# Patient Record
Sex: Male | Born: 1945 | Race: White | Hispanic: No | Marital: Married
Health system: Southern US, Community
[De-identification: ages and names within clinical notes are randomized; demographics above are authoritative.]

## PROBLEM LIST (undated history)

## (undated) DIAGNOSIS — I4891 Unspecified atrial fibrillation: Secondary | ICD-10-CM

## (undated) DIAGNOSIS — J309 Allergic rhinitis, unspecified: Secondary | ICD-10-CM

## (undated) DIAGNOSIS — D649 Anemia, unspecified: Secondary | ICD-10-CM

## (undated) DIAGNOSIS — M5030 Other cervical disc degeneration, unspecified cervical region: Secondary | ICD-10-CM

## (undated) DIAGNOSIS — M5136 Other intervertebral disc degeneration, lumbar region: Secondary | ICD-10-CM

## (undated) DIAGNOSIS — E538 Deficiency of other specified B group vitamins: Secondary | ICD-10-CM

## (undated) DIAGNOSIS — M51369 Other intervertebral disc degeneration, lumbar region without mention of lumbar back pain or lower extremity pain: Secondary | ICD-10-CM

## (undated) DIAGNOSIS — I10 Essential (primary) hypertension: Secondary | ICD-10-CM

## (undated) DIAGNOSIS — I499 Cardiac arrhythmia, unspecified: Secondary | ICD-10-CM

---

## 2005-07-20 ENCOUNTER — Ambulatory Visit: Payer: Self-pay | Admitting: Gastroenterology

## 2006-07-25 ENCOUNTER — Other Ambulatory Visit: Payer: Self-pay

## 2006-07-25 ENCOUNTER — Ambulatory Visit: Payer: Self-pay | Admitting: General Surgery

## 2006-07-26 ENCOUNTER — Ambulatory Visit: Payer: Self-pay | Admitting: General Surgery

## 2015-06-13 ENCOUNTER — Ambulatory Visit: Admission: RE | Admit: 2015-06-13 | Payer: Medicare PPO | Source: Ambulatory Visit | Admitting: Gastroenterology

## 2015-06-13 ENCOUNTER — Encounter: Admission: RE | Payer: Self-pay | Source: Ambulatory Visit

## 2015-06-13 SURGERY — COLONOSCOPY WITH PROPOFOL
Anesthesia: General

## 2015-07-29 ENCOUNTER — Encounter: Payer: Self-pay | Admitting: *Deleted

## 2015-08-01 ENCOUNTER — Ambulatory Visit
Admission: RE | Admit: 2015-08-01 | Discharge: 2015-08-01 | Disposition: A | Discharge status: Home / Self Care | Payer: Medicare PPO | Source: Ambulatory Visit | Attending: Gastroenterology | Admitting: Gastroenterology

## 2015-08-01 ENCOUNTER — Ambulatory Visit: Payer: Medicare PPO | Admitting: Anesthesiology

## 2015-08-01 ENCOUNTER — Encounter
Admission: RE | Disposition: A | Discharge status: Home / Self Care | Payer: Self-pay | Source: Ambulatory Visit | Attending: Gastroenterology

## 2015-08-01 DIAGNOSIS — M5136 Other intervertebral disc degeneration, lumbar region: Secondary | ICD-10-CM | POA: Insufficient documentation

## 2015-08-01 DIAGNOSIS — J309 Allergic rhinitis, unspecified: Secondary | ICD-10-CM | POA: Diagnosis not present

## 2015-08-01 DIAGNOSIS — Z888 Allergy status to other drugs, medicaments and biological substances status: Secondary | ICD-10-CM | POA: Insufficient documentation

## 2015-08-01 DIAGNOSIS — M5030 Other cervical disc degeneration, unspecified cervical region: Secondary | ICD-10-CM | POA: Diagnosis not present

## 2015-08-01 DIAGNOSIS — Z881 Allergy status to other antibiotic agents status: Secondary | ICD-10-CM | POA: Insufficient documentation

## 2015-08-01 DIAGNOSIS — E538 Deficiency of other specified B group vitamins: Secondary | ICD-10-CM | POA: Diagnosis not present

## 2015-08-01 DIAGNOSIS — K5730 Diverticulosis of large intestine without perforation or abscess without bleeding: Secondary | ICD-10-CM | POA: Insufficient documentation

## 2015-08-01 DIAGNOSIS — Z79899 Other long term (current) drug therapy: Secondary | ICD-10-CM | POA: Insufficient documentation

## 2015-08-01 DIAGNOSIS — I10 Essential (primary) hypertension: Secondary | ICD-10-CM | POA: Insufficient documentation

## 2015-08-01 DIAGNOSIS — I499 Cardiac arrhythmia, unspecified: Secondary | ICD-10-CM | POA: Diagnosis not present

## 2015-08-01 DIAGNOSIS — I4891 Unspecified atrial fibrillation: Secondary | ICD-10-CM | POA: Insufficient documentation

## 2015-08-01 DIAGNOSIS — D509 Iron deficiency anemia, unspecified: Secondary | ICD-10-CM | POA: Insufficient documentation

## 2015-08-01 HISTORY — PX: COLONOSCOPY WITH PROPOFOL: SHX5780

## 2015-08-01 HISTORY — DX: Other cervical disc degeneration, unspecified cervical region: M50.30

## 2015-08-01 HISTORY — DX: Anemia, unspecified: D64.9

## 2015-08-01 HISTORY — DX: Deficiency of other specified B group vitamins: E53.8

## 2015-08-01 HISTORY — DX: Other intervertebral disc degeneration, lumbar region without mention of lumbar back pain or lower extremity pain: M51.369

## 2015-08-01 HISTORY — DX: Allergic rhinitis, unspecified: J30.9

## 2015-08-01 HISTORY — DX: Essential (primary) hypertension: I10

## 2015-08-01 HISTORY — DX: Cardiac arrhythmia, unspecified: I49.9

## 2015-08-01 HISTORY — DX: Other intervertebral disc degeneration, lumbar region: M51.36

## 2015-08-01 HISTORY — PX: ESOPHAGOGASTRODUODENOSCOPY (EGD) WITH PROPOFOL: SHX5813

## 2015-08-01 HISTORY — DX: Unspecified atrial fibrillation: I48.91

## 2015-08-01 SURGERY — COLONOSCOPY WITH PROPOFOL
Anesthesia: General

## 2015-08-01 MED ORDER — MIDAZOLAM HCL 2 MG/2ML IJ SOLN
INTRAMUSCULAR | Status: DC | PRN
  Administered 2015-08-01: 1 mg via INTRAVENOUS

## 2015-08-01 MED ORDER — PROPOFOL 500 MG/50ML IV EMUL
INTRAVENOUS | Status: DC | PRN
  Administered 2015-08-01: 75 ug/kg/min via INTRAVENOUS

## 2015-08-01 MED ORDER — FENTANYL CITRATE (PF) 100 MCG/2ML IJ SOLN
INTRAMUSCULAR | Status: DC | PRN
  Administered 2015-08-01: 50 ug via INTRAVENOUS

## 2015-08-01 MED ORDER — SODIUM CHLORIDE 0.9 % IV SOLN
INTRAVENOUS | Status: DC
  Administered 2015-08-01: 1000 mL via INTRAVENOUS

## 2015-08-01 MED ORDER — LACTATED RINGERS IV SOLN
INTRAVENOUS | Status: DC | PRN
  Administered 2015-08-01: 08:00:00 via INTRAVENOUS

## 2015-08-01 MED ORDER — SODIUM CHLORIDE 0.9 % IV SOLN: INTRAVENOUS | Status: DC

## 2015-08-01 NOTE — Transfer of Care (Signed)
Immediate Anesthesia Transfer of Care Note  Patient: Timothy Gilbert  Procedure(s) Performed: Procedure(s): COLONOSCOPY WITH PROPOFOL (N/A) ESOPHAGOGASTRODUODENOSCOPY (EGD) WITH PROPOFOL (N/A)  Patient Location: PACU  Anesthesia Type:General  Level of Consciousness: awake  Airway & Oxygen Therapy: Patient Spontanous Breathing and Patient connected to nasal cannula oxygen  Post-op Assessment: Report given to RN  Post vital signs: Reviewed and stable  Last Vitals:  Filed Vitals:   08/01/15 0712 08/01/15 0832  BP: 149/73 153/65  Pulse: 56 75  Temp: 35.9 C 36.4 C  Resp: 16 16    Complications: No apparent anesthesia complications

## 2015-08-01 NOTE — Op Note (Signed)
[facility] Gastroenterology Patient Name: Timothy Gilbert Procedure Date: 08/01/2015 9:27 AM MRN: [id number] Account #: [id number] Date of Birth: [birth year] Admit Type: Outpatient Age: 70 Room: [facility] ENDO ROOM 4 Gender: Male Note Status: Finalized Procedure:            Upper GI endoscopy Indications:          Iron deficiency anemia Providers:            Ezzard Standing. Bluford Kaufmann, MD Medicines:            Monitored Anesthesia Care Complications:        No immediate complications. Procedure:            Pre-Anesthesia Assessment:                       - Prior to the procedure, a History and Physical was                        performed, and patient medications, allergies and                        sensitivities were reviewed. The patient's tolerance of                        previous anesthesia was reviewed.                       - The risks and benefits of the procedure and the                        sedation options and risks were discussed with the                        patient. All questions were answered and informed                        consent was obtained.                       - After reviewing the risks and benefits, the patient                        was deemed in satisfactory condition to undergo the                        procedure.                       After obtaining informed consent, the endoscope was                        passed under direct vision. Throughout the procedure,                        the patient's blood pressure, pulse, and oxygen                        saturations were monitored continuously. The Olympus                        GIF H180J colonscope (W#:[id number]) was introduced  through the and advanced to the second part of                        duodenum. The upper GI endoscopy was accomplished                        without difficulty. The patient tolerated the procedure                        well. Findings:      The examined  esophagus was normal.      The entire examined stomach was normal.      The examined duodenum was normal. Impression:           - Normal esophagus.                       - Normal stomach.                       - Normal examined duodenum.                       - No specimens collected. Recommendation:       - Discharge patient to home.                       - Observe patient's clinical course.                       - The findings and recommendations were discussed with                        the patient. Procedure Code(s):    --- Professional ---                       [phone], Esophagogastroduodenoscopy, flexible, transoral;                        diagnostic, including collection of specimen(s) by                        brushing or washing, when performed (separate procedure) Diagnosis Code(s):    --- Professional ---                       D50.9, Iron deficiency anemia, unspecified CPT copyright 2016 American Medical Association. All rights reserved. The codes documented in this report are preliminary and upon coder review may  be revised to meet current compliance requirements. Wallace Cullens, MD 08/01/2015 9:32:58 AM This report has been signed electronically. Number of Addenda: 0 Note Initiated On: 08/01/2015 9:27 AM      [facility] [place]

## 2015-08-01 NOTE — H&P (Signed)
    Primary Care Physician:  Ezequiel Kayser, MD Primary Gastroenterologist:  Dr. Candace Cruise  Pre-Procedure History & Physical: HPI:  Timothy Gilbert is a 70 y.o. male is here for an EGD/colonoscopy.   Past Medical History  Diagnosis Date  . Atrial fibrillation ([place])   . DDD (degenerative disc disease), cervical   . Anemia   . Hypertension   . Dysrhythmia   . B12 deficiency   . Degenerative disc disease, lumbar   . Allergic rhinitis     No past surgical history on file.  Prior to Admission medications   Medication Sig Start Date End Date Taking? Authorizing Provider  atenolol (TENORMIN) 50 MG tablet Take 50 mg by mouth daily.   Yes Historical Provider, MD  Cyanocobalamin 1000 MCG/ML KIT Inject as directed.   Yes Historical Provider, MD  flecainide (TAMBOCOR) 100 MG tablet Take 100 mg by mouth 2 (two) times daily.   Yes Historical Provider, MD  ibuprofen (ADVIL,MOTRIN) 200 MG tablet Take 200 mg by mouth every 6 (six) hours as needed.   Yes Historical Provider, MD  loratadine (CLARITIN) 10 MG tablet Take 10 mg by mouth daily.   Yes Historical Provider, MD  Multiple Vitamin (MULTIVITAMIN) tablet Take 1 tablet by mouth daily.   Yes Historical Provider, MD  valsartan-hydrochlorothiazide (DIOVAN-HCT) 320-25 MG tablet Take 1 tablet by mouth daily.   Yes Historical Provider, MD    Allergies as of 07/13/2015 - never reviewed  Allergen Reaction Noted  . Augmentin [amoxicillin-pot clavulanate]  06/10/2015  . Hctz [hydrochlorothiazide]  06/10/2015    No family history on file.  Social History   Social History  . Marital Status: Married    Spouse Name: N/A  . Number of Children: N/A  . Years of Education: N/A   Occupational History  . Not on file.   Social History Main Topics  . Smoking status: Not on file  . Smokeless tobacco: Not on file  . Alcohol Use: Not on file  . Drug Use: Not on file  . Sexual Activity: Not on file   Other Topics Concern  . Not on file   Social History  Narrative  . No narrative on file    Review of Systems: See HPI, otherwise negative ROS  Physical Exam: BP 149/73 mmHg  Pulse 56  Temp(Src) 96.6 F (35.9 C) (Tympanic)  Resp 16  Ht '6\' 3"'$  (1.905 m)  Wt 88.451 kg (195 lb)  BMI 24.37 kg/m2  SpO2 100% General:   Alert,  pleasant and cooperative in NAD Head:  Normocephalic and atraumatic. Neck:  Supple; no masses or thyromegaly. Lungs:  Clear throughout to auscultation.    Heart:  Regular rate and rhythm. Abdomen:  Soft, nontender and nondistended. Normal bowel sounds, without guarding, and without rebound.   Neurologic:  Alert and  oriented x4;  grossly normal neurologically.  Impression/Plan: Timothy Gilbert is here for an EGD/colonoscopy to be performed for Fe def anemia.  Risks, benefits, limitations, and alternatives regarding  EGD/colonoscopy have been reviewed with the patient.  Questions have been answered.  All parties agreeable.   Maurisa Tesmer, Lupita Dawn, MD  08/01/2015, 8:03 AM

## 2015-08-01 NOTE — Anesthesia Preprocedure Evaluation (Signed)
Anesthesia Evaluation  Patient identified by MRN, date of birth, ID band Patient awake    Reviewed: Allergy & Precautions, H&P , NPO status , Patient's Chart, lab work & pertinent test results, reviewed documented beta blocker date and time   Airway Mallampati: II   Neck ROM: full    Dental  (+) Poor Dentition   Pulmonary neg pulmonary ROS,    Pulmonary exam normal        Cardiovascular hypertension, negative cardio ROS Normal cardiovascular exam+ dysrhythmias Atrial Fibrillation      Neuro/Psych negative neurological ROS  negative psych ROS   GI/Hepatic negative GI ROS, Neg liver ROS,   Endo/Other  negative endocrine ROS  Renal/GU negative Renal ROS  negative genitourinary   Musculoskeletal   Abdominal   Peds  Hematology negative hematology ROS (+) anemia ,   Anesthesia Other Findings Past Medical History:   Atrial fibrillation ([facility])                                    DDD (degenerative disc disease), cervical                    Anemia                                                       Hypertension                                                 Dysrhythmia                                                  B12 deficiency                                               Degenerative disc disease, lumbar                            Allergic rhinitis                                          No past surgical history on file.   Reproductive/Obstetrics                             Anesthesia Physical Anesthesia Plan  ASA: III  Anesthesia Plan: General   Post-op Pain Management:    Induction:   Airway Management Planned:   Additional Equipment:   Intra-op Plan:   Post-operative Plan:   Informed Consent: I have reviewed the patients History and Physical, chart, labs and discussed the procedure including the risks, benefits and alternatives for the proposed anesthesia with the patient  or authorized representative  who has indicated his/her understanding and acceptance.   Dental Advisory Given  Plan Discussed with: CRNA  Anesthesia Plan Comments:         Anesthesia Quick Evaluation

## 2015-08-01 NOTE — Anesthesia Postprocedure Evaluation (Deleted)
Anesthesia Post Note  Patient: Nisaiah Bechtol [place]  Procedure(s) Performed: Procedure(s) (LRB): COLONOSCOPY WITH PROPOFOL (N/A) ESOPHAGOGASTRODUODENOSCOPY (EGD) WITH PROPOFOL (N/A)  Patient location during evaluation: PACU Anesthesia Type: General Level of consciousness: awake and alert Pain management: pain level controlled Vital Signs Assessment: post-procedure vital signs reviewed and stable Respiratory status: spontaneous breathing, nonlabored ventilation, respiratory function stable and patient connected to nasal cannula oxygen Cardiovascular status: blood pressure returned to baseline and stable Postop Assessment: no signs of nausea or vomiting Anesthetic complications: no    Last Vitals:  Filed Vitals:   08/01/15 0712  BP: 149/73  Pulse: 56  Temp: 35.9 C  Resp: 16    Last Pain: There were no vitals filed for this visit.               Yevette Edwards

## 2015-08-01 NOTE — Anesthesia Postprocedure Evaluation (Signed)
Anesthesia Post Note  Patient: Timothy Gilbert  Procedure(s) Performed: Procedure(s) (LRB): COLONOSCOPY WITH PROPOFOL (N/A) ESOPHAGOGASTRODUODENOSCOPY (EGD) WITH PROPOFOL (N/A)  Patient location during evaluation: PACU Anesthesia Type: General Level of consciousness: awake and alert Pain management: pain level controlled Vital Signs Assessment: post-procedure vital signs reviewed and stable Respiratory status: spontaneous breathing, nonlabored ventilation, respiratory function stable and patient connected to nasal cannula oxygen Cardiovascular status: blood pressure returned to baseline and stable Postop Assessment: no signs of nausea or vomiting Anesthetic complications: no    Last Vitals:  Filed Vitals:   08/01/15 0901 08/01/15 0911  BP: 150/77 152/94  Pulse: 55 53  Temp:    Resp: 16 14    Last Pain:  Filed Vitals:   08/01/15 0916  PainSc: Asleep                 Yevette Edwards

## 2015-08-01 NOTE — Op Note (Addendum)
[facility] Gastroenterology Patient Name: Timothy Gilbert Procedure Date: 08/01/2015 7:58 AM MRN: [id number] Account #: [id number] Date of Birth: May 05, 1946 Admit Type: Outpatient Age: 70 Room: [facility] ENDO ROOM 4 Gender: Male Note Status: Supervisor Override Procedure:            Colonoscopy Indications:          Iron deficiency anemia Providers:            Ezzard Standing. Bluford Kaufmann, MD Referring MD:         Neomia Dear. Harrington Challenger, MD (Referring MD) Medicines:            Monitored Anesthesia Care Complications:        No immediate complications. Procedure:            Pre-Anesthesia Assessment:                       - Prior to the procedure, a History and Physical was                        performed, and patient medications, allergies and                        sensitivities were reviewed. The patient's tolerance of                        previous anesthesia was reviewed.                       - The risks and benefits of the procedure and the                        sedation options and risks were discussed with the                        patient. All questions were answered and informed                        consent was obtained.                       - After reviewing the risks and benefits, the patient                        was deemed in satisfactory condition to undergo the                        procedure.                       After obtaining informed consent, the colonoscope was                        passed under direct vision. Throughout the procedure,                        the patient's blood pressure, pulse, and oxygen                        saturations were monitored continuously.The upper GI  endoscopy was accomplished without difficulty. The                        patient tolerated the procedure well. After obtaining                        informed consent, the colonoscope was passed under                        direct vision. Throughout the procedure, the  patient's                        blood pressure, pulse, and oxygen saturations were                        monitored continuously. The Colonoscope was introduced                        through the anus and advanced to the the cecum,                        identified by appendiceal orifice and ileocecal valve.                        The quality of the bowel preparation was good. Findings:      Multiple small and large-mouthed diverticula were found in the sigmoid       colon.      The exam was otherwise without abnormality. Impression:           - Diverticulosis in the sigmoid colon.                       - The examination was otherwise normal.                       - No specimens collected. Recommendation:       - Discharge patient to home.                       - The findings and recommendations were discussed with                        the patient.                       - If anemia persists, consider video capsule study. Procedure Code(s):    --- Professional ---                       [phone], Colonoscopy, flexible; diagnostic, including                        collection of specimen(s) by brushing or washing, when                        performed (separate procedure) Diagnosis Code(s):    --- Professional ---                       D50.9, Iron deficiency anemia, unspecified  K57.30, Diverticulosis of large intestine without                        perforation or abscess without bleeding CPT copyright 2016 American Medical Association. All rights reserved. The codes documented in this report are preliminary and upon coder review may  be revised to meet current compliance requirements. Wallace Cullens, MD 08/01/2015 8:34:13 AM This report has been signed electronically. Number of Addenda: 0 Note Initiated On: 08/01/2015 7:58 AM Scope Withdrawal Time: 0 hours 4 minutes 24 seconds  Total Procedure Duration: 0 hours 11 minutes 58 seconds       [facility]

## 2015-08-03 ENCOUNTER — Encounter: Payer: Self-pay | Admitting: Gastroenterology

## 2016-02-13 ENCOUNTER — Other Ambulatory Visit: Payer: Self-pay | Admitting: Physical Medicine and Rehabilitation

## 2016-02-13 DIAGNOSIS — M47812 Spondylosis without myelopathy or radiculopathy, cervical region: Secondary | ICD-10-CM

## 2016-02-22 ENCOUNTER — Ambulatory Visit
Admission: RE | Admit: 2016-02-22 | Discharge: 2016-02-22 | Disposition: A | Discharge status: Home / Self Care | Payer: Medicare PPO | Source: Ambulatory Visit | Attending: Physical Medicine and Rehabilitation | Admitting: Physical Medicine and Rehabilitation

## 2016-02-22 DIAGNOSIS — M47812 Spondylosis without myelopathy or radiculopathy, cervical region: Secondary | ICD-10-CM

## 2016-02-22 DIAGNOSIS — M50222 Other cervical disc displacement at C5-C6 level: Secondary | ICD-10-CM | POA: Diagnosis not present

## 2017-09-27 IMAGING — MR MR CERVICAL SPINE W/O CM
5 series · 34 of 48 positions shown · non-contrast
Comparison: None.

CLINICAL DATA: Neck pain for several months radiating into the
head. No known injury.

EXAM:
MRI CERVICAL SPINE WITHOUT CONTRAST
TECHNIQUE: Multiplanar, multisequence MR imaging of the cervical spine was
performed. No intravenous contrast was administered.

[Series 2: T2 · sagittal · 3.0mm · 0.70mm/px · 6 of 13 slices shown (1 of 2)]
[im 1/13]
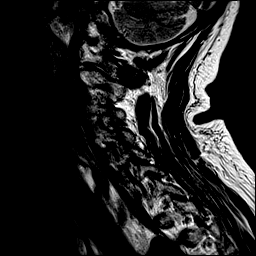
[im 3/13]
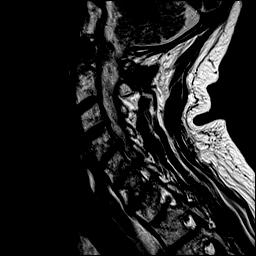
[im 5/13]
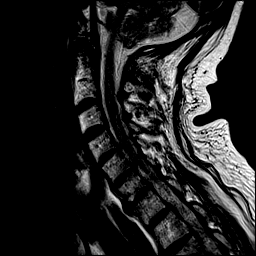
[im 8/13]
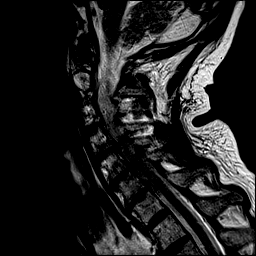
[im 10/13]
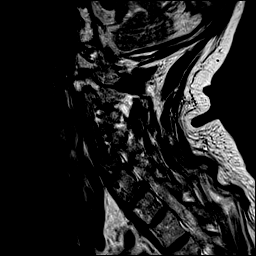
[im 13/13]
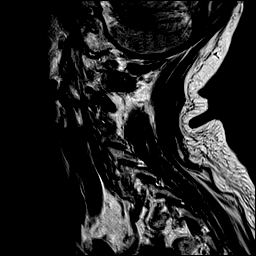

[Series 3: T1 · sagittal · 3.0mm · 0.70mm/px · 7 of 13 slices shown]
[im 1/13]
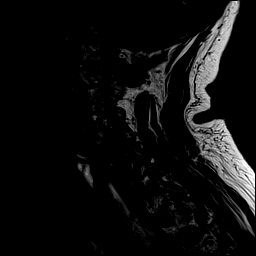
[im 3/13]
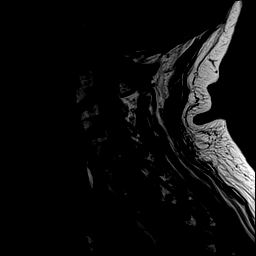
[im 5/13]
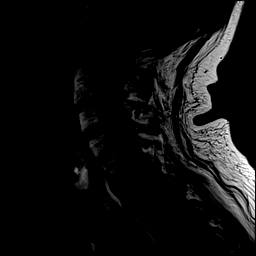
[im 7/13]
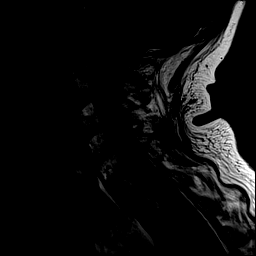
[im 9/13]
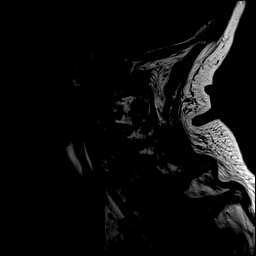
[im 11/13]
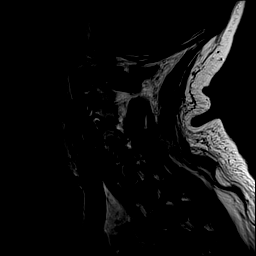
[im 13/13]
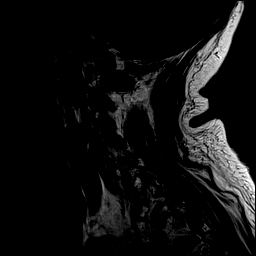

[Series 4: STIR · sagittal · 3.0mm · 0.35mm/px · 7 of 13 slices shown]
[im 1/13]
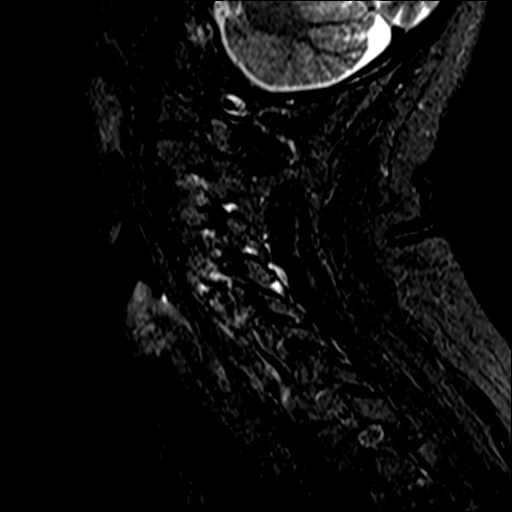
[im 3/13]
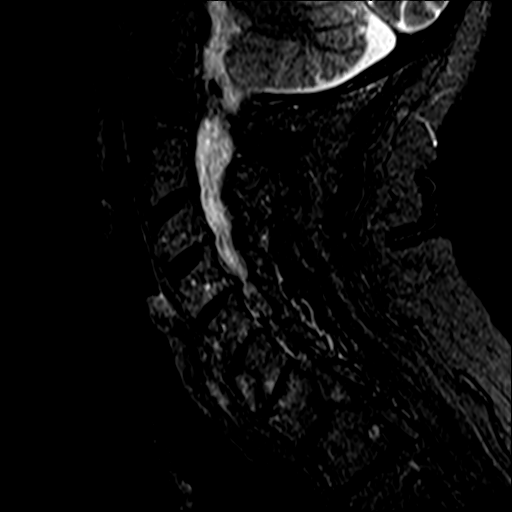
[im 5/13]
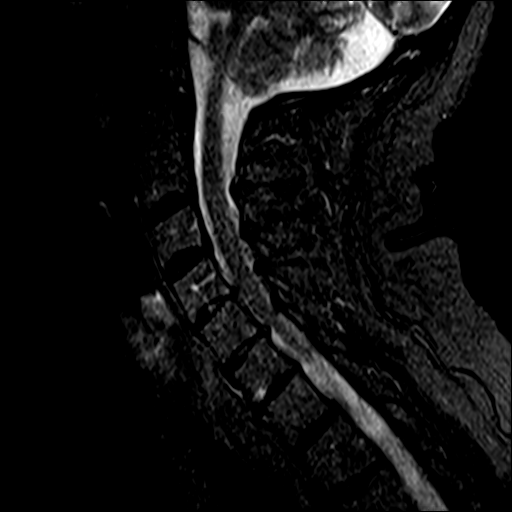
[im 7/13]
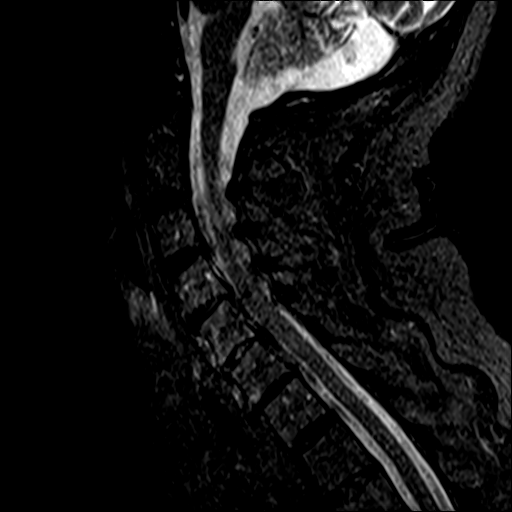
[im 9/13]
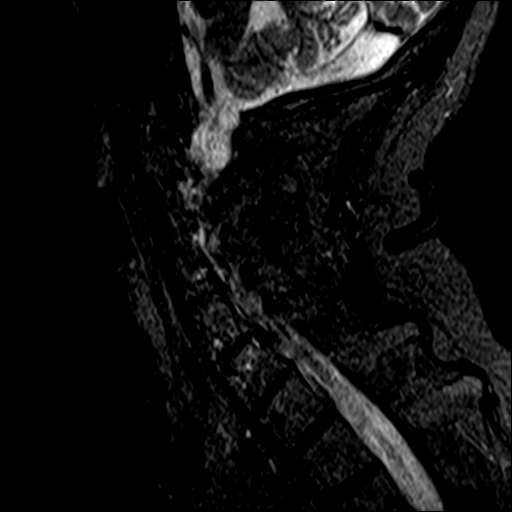
[im 11/13]
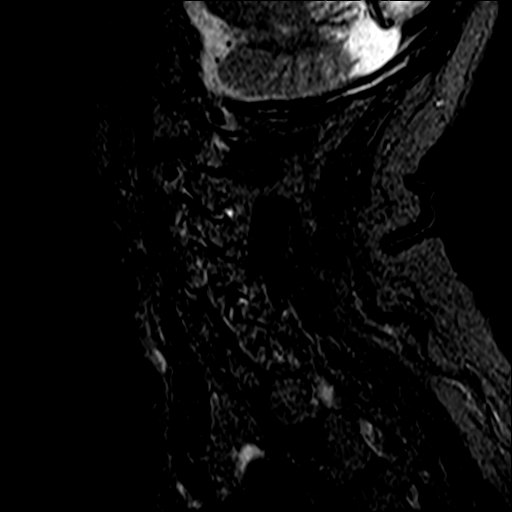
[im 13/13]
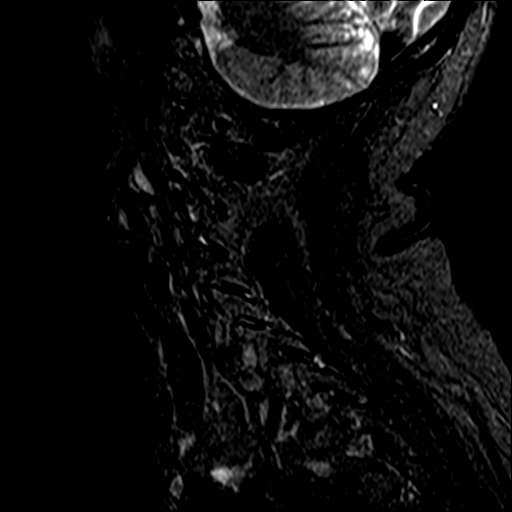

[Series 5: T2 · axial · 3.0mm · 0.70mm/px · z∈[-54,+37]mm · 8 of 27 slices shown (2 of 2)]
[im 1/27]
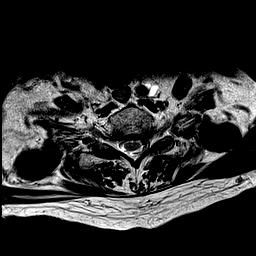
[im 5/27]
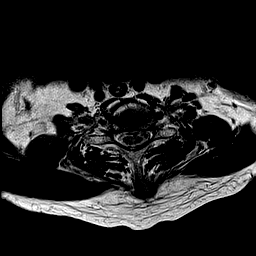
[im 9/27]
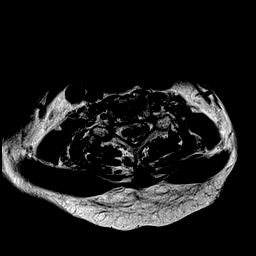
[im 13/27]
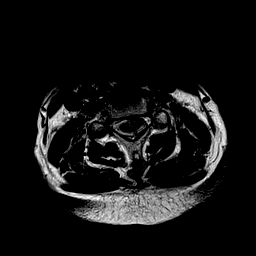
[im 15/27]
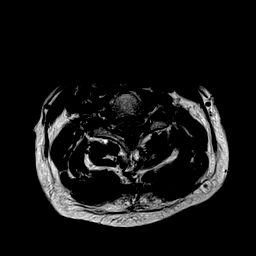
[im 19/27]
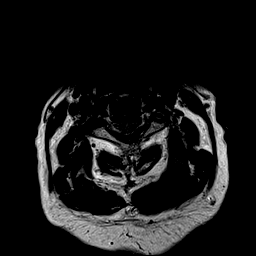
[im 23/27]
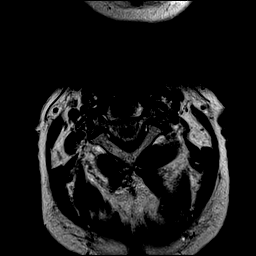
[im 27/27]
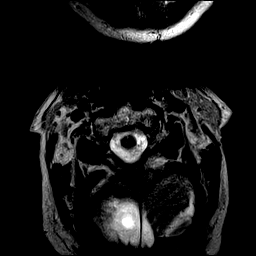

[Series 6: mpgr ax · axial · 3.0mm · 0.35mm/px · z∈[-54,+9]mm · 6 of 27 slices shown]
[im 1/27]
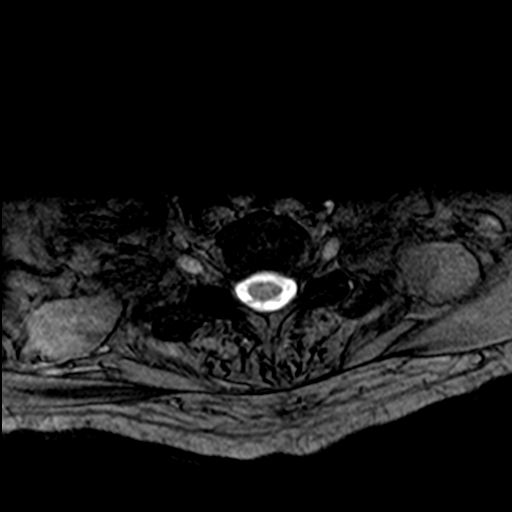
[im 5/27]
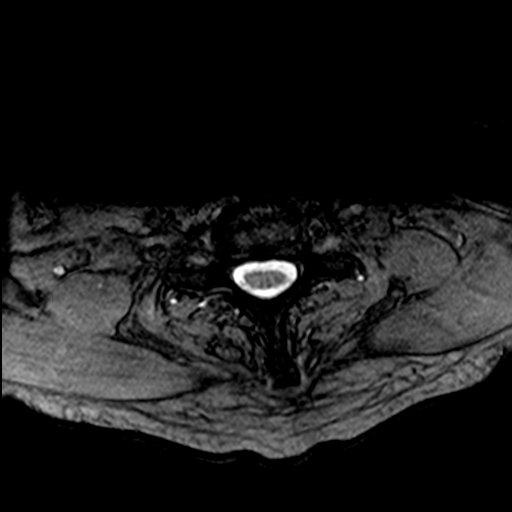
[im 9/27]
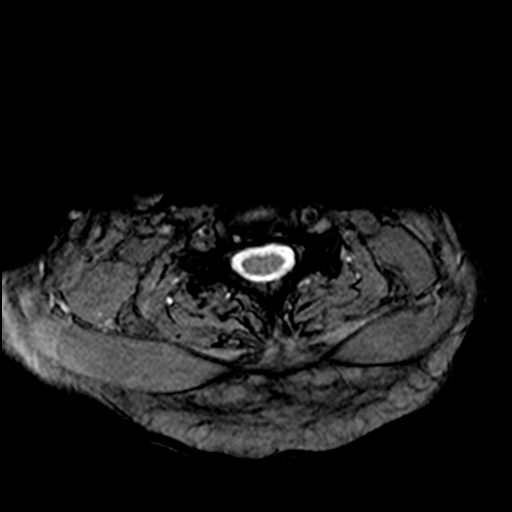
[im 13/27]
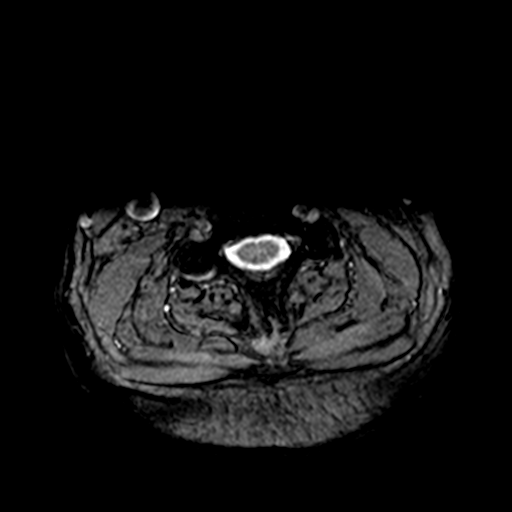
[im 15/27]
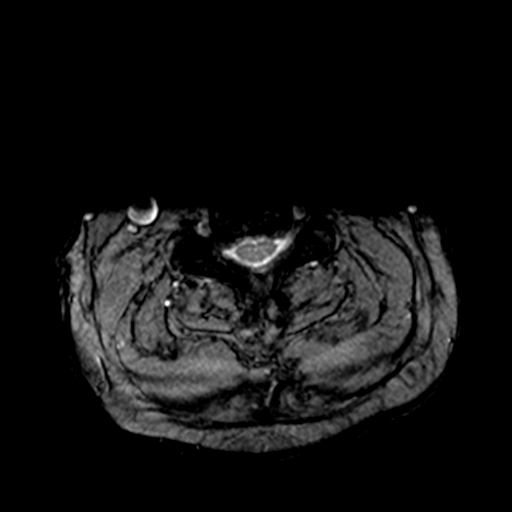
[im 19/27]
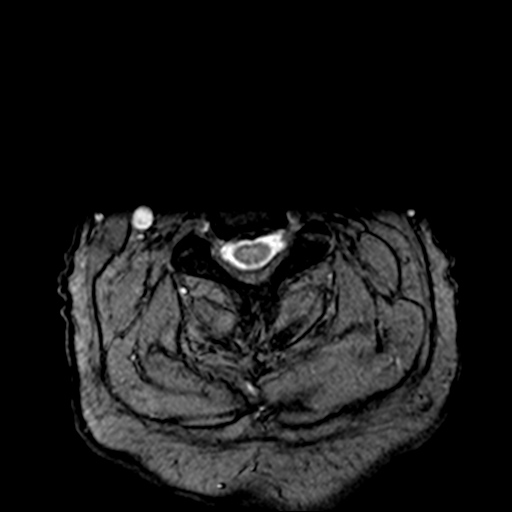

[34 of 48 positions shown; findings below may reference images not displayed]

FINDINGS: Alignment: Maintained.

Vertebrae: No fracture. No worrisome marrow lesion. Degenerative
endplate signal change noted at C6-7 eccentric to the right.

Cord: Appears normal.

Posterior Fossa, vertebral arteries, paraspinal tissues:
Unremarkable.

Disc levels:

C2-3:  Negative.

C3-4: Minimal disc bulge without central canal or foraminal
stenosis.

C4-5: Shallow disc bulge and endplate spur without central canal or
foraminal stenosis.

C5-6: Shallow disc bulge and uncovertebral disease. The ventral
thecal sac is narrowed but not effaced. Mild to moderate bilateral
foraminal narrowing is seen.

C6-7: Shallow disc bulge without central canal stenosis. Mild left
foraminal narrowing is seen. The right foramen is open.

C7-T1:  Negative.
IMPRESSION: Mild to moderate bilateral foraminal narrowing at C5-6 where a
shallow disc bulge narrows but does not efface the ventral thecal
sac.

Mild left foraminal narrowing C6-7 due to uncovertebral disease.

## 2019-06-10 ENCOUNTER — Ambulatory Visit: Payer: Medicare PPO | Attending: Internal Medicine

## 2019-06-10 DIAGNOSIS — Z20822 Contact with and (suspected) exposure to covid-19: Secondary | ICD-10-CM

## 2019-06-11 LAB — NOVEL CORONAVIRUS, NAA: SARS-CoV-2, NAA: NOT DETECTED

## 2019-06-12 ENCOUNTER — Telehealth: Payer: Self-pay

## 2019-06-12 NOTE — Telephone Encounter (Signed)
Pt notified of negative COVID-19 results. Understanding verbalized.  Timothy Gilbert   

## 2021-06-09 DIAGNOSIS — I480 Paroxysmal atrial fibrillation: Secondary | ICD-10-CM | POA: Diagnosis not present

## 2021-06-12 DIAGNOSIS — E538 Deficiency of other specified B group vitamins: Secondary | ICD-10-CM | POA: Diagnosis not present

## 2021-07-12 DIAGNOSIS — I480 Paroxysmal atrial fibrillation: Secondary | ICD-10-CM | POA: Diagnosis not present

## 2021-07-12 DIAGNOSIS — I10 Essential (primary) hypertension: Secondary | ICD-10-CM | POA: Diagnosis not present

## 2021-07-12 DIAGNOSIS — Z7901 Long term (current) use of anticoagulants: Secondary | ICD-10-CM | POA: Diagnosis not present

## 2021-07-13 DIAGNOSIS — E538 Deficiency of other specified B group vitamins: Secondary | ICD-10-CM | POA: Diagnosis not present

## 2021-08-10 DIAGNOSIS — E538 Deficiency of other specified B group vitamins: Secondary | ICD-10-CM | POA: Diagnosis not present

## 2021-09-11 DIAGNOSIS — E538 Deficiency of other specified B group vitamins: Secondary | ICD-10-CM | POA: Diagnosis not present

## 2021-10-11 DIAGNOSIS — I480 Paroxysmal atrial fibrillation: Secondary | ICD-10-CM | POA: Diagnosis not present

## 2021-10-11 DIAGNOSIS — I10 Essential (primary) hypertension: Secondary | ICD-10-CM | POA: Diagnosis not present

## 2021-10-11 DIAGNOSIS — Z7901 Long term (current) use of anticoagulants: Secondary | ICD-10-CM | POA: Diagnosis not present

## 2021-10-12 DIAGNOSIS — E538 Deficiency of other specified B group vitamins: Secondary | ICD-10-CM | POA: Diagnosis not present

## 2021-11-08 DIAGNOSIS — M216X1 Other acquired deformities of right foot: Secondary | ICD-10-CM | POA: Diagnosis not present

## 2021-11-08 DIAGNOSIS — M2041 Other hammer toe(s) (acquired), right foot: Secondary | ICD-10-CM | POA: Diagnosis not present

## 2021-11-08 DIAGNOSIS — S91311A Laceration without foreign body, right foot, initial encounter: Secondary | ICD-10-CM | POA: Diagnosis not present

## 2021-11-08 DIAGNOSIS — M216X2 Other acquired deformities of left foot: Secondary | ICD-10-CM | POA: Diagnosis not present

## 2021-11-08 DIAGNOSIS — M5136 Other intervertebral disc degeneration, lumbar region: Secondary | ICD-10-CM | POA: Diagnosis not present

## 2021-11-08 DIAGNOSIS — M79675 Pain in left toe(s): Secondary | ICD-10-CM | POA: Diagnosis not present

## 2021-11-08 DIAGNOSIS — M2042 Other hammer toe(s) (acquired), left foot: Secondary | ICD-10-CM | POA: Diagnosis not present

## 2021-11-08 DIAGNOSIS — B351 Tinea unguium: Secondary | ICD-10-CM | POA: Diagnosis not present

## 2021-11-08 DIAGNOSIS — G629 Polyneuropathy, unspecified: Secondary | ICD-10-CM | POA: Diagnosis not present

## 2021-11-08 DIAGNOSIS — M79674 Pain in right toe(s): Secondary | ICD-10-CM | POA: Diagnosis not present

## 2021-11-08 DIAGNOSIS — Q6671 Congenital pes cavus, right foot: Secondary | ICD-10-CM | POA: Diagnosis not present

## 2021-11-08 DIAGNOSIS — Q6672 Congenital pes cavus, left foot: Secondary | ICD-10-CM | POA: Diagnosis not present

## 2021-11-13 DIAGNOSIS — E538 Deficiency of other specified B group vitamins: Secondary | ICD-10-CM | POA: Diagnosis not present

## 2021-12-13 DIAGNOSIS — E538 Deficiency of other specified B group vitamins: Secondary | ICD-10-CM | POA: Diagnosis not present

## 2022-01-15 DIAGNOSIS — E538 Deficiency of other specified B group vitamins: Secondary | ICD-10-CM | POA: Diagnosis not present

## 2022-02-13 DIAGNOSIS — H10503 Unspecified blepharoconjunctivitis, bilateral: Secondary | ICD-10-CM | POA: Diagnosis not present

## 2022-02-15 DIAGNOSIS — E538 Deficiency of other specified B group vitamins: Secondary | ICD-10-CM | POA: Diagnosis not present

## 2022-02-27 DIAGNOSIS — H10503 Unspecified blepharoconjunctivitis, bilateral: Secondary | ICD-10-CM | POA: Diagnosis not present

## 2022-03-08 DIAGNOSIS — M47812 Spondylosis without myelopathy or radiculopathy, cervical region: Secondary | ICD-10-CM | POA: Diagnosis not present

## 2022-03-08 DIAGNOSIS — Z1329 Encounter for screening for other suspected endocrine disorder: Secondary | ICD-10-CM | POA: Diagnosis not present

## 2022-03-08 DIAGNOSIS — E538 Deficiency of other specified B group vitamins: Secondary | ICD-10-CM | POA: Diagnosis not present

## 2022-03-08 DIAGNOSIS — D6869 Other thrombophilia: Secondary | ICD-10-CM | POA: Diagnosis not present

## 2022-03-08 DIAGNOSIS — M722 Plantar fascial fibromatosis: Secondary | ICD-10-CM | POA: Diagnosis not present

## 2022-03-08 DIAGNOSIS — I480 Paroxysmal atrial fibrillation: Secondary | ICD-10-CM | POA: Diagnosis not present

## 2022-03-08 DIAGNOSIS — Z7901 Long term (current) use of anticoagulants: Secondary | ICD-10-CM | POA: Diagnosis not present

## 2022-03-08 DIAGNOSIS — R7309 Other abnormal glucose: Secondary | ICD-10-CM | POA: Diagnosis not present

## 2022-03-08 DIAGNOSIS — Z79899 Other long term (current) drug therapy: Secondary | ICD-10-CM | POA: Diagnosis not present

## 2022-03-08 DIAGNOSIS — Z125 Encounter for screening for malignant neoplasm of prostate: Secondary | ICD-10-CM | POA: Diagnosis not present

## 2022-03-08 DIAGNOSIS — J301 Allergic rhinitis due to pollen: Secondary | ICD-10-CM | POA: Diagnosis not present

## 2022-03-08 DIAGNOSIS — M5136 Other intervertebral disc degeneration, lumbar region: Secondary | ICD-10-CM | POA: Diagnosis not present

## 2022-03-08 DIAGNOSIS — Z0000 Encounter for general adult medical examination without abnormal findings: Secondary | ICD-10-CM | POA: Diagnosis not present

## 2022-03-08 DIAGNOSIS — Z13220 Encounter for screening for lipoid disorders: Secondary | ICD-10-CM | POA: Diagnosis not present

## 2022-03-08 DIAGNOSIS — I10 Essential (primary) hypertension: Secondary | ICD-10-CM | POA: Diagnosis not present

## 2022-03-08 DIAGNOSIS — Z23 Encounter for immunization: Secondary | ICD-10-CM | POA: Diagnosis not present

## 2022-03-08 DIAGNOSIS — D509 Iron deficiency anemia, unspecified: Secondary | ICD-10-CM | POA: Diagnosis not present

## 2022-03-08 DIAGNOSIS — E871 Hypo-osmolality and hyponatremia: Secondary | ICD-10-CM | POA: Diagnosis not present

## 2022-03-19 DIAGNOSIS — E538 Deficiency of other specified B group vitamins: Secondary | ICD-10-CM | POA: Diagnosis not present

## 2022-04-10 DIAGNOSIS — H10503 Unspecified blepharoconjunctivitis, bilateral: Secondary | ICD-10-CM | POA: Diagnosis not present

## 2022-04-11 DIAGNOSIS — I480 Paroxysmal atrial fibrillation: Secondary | ICD-10-CM | POA: Diagnosis not present

## 2022-04-11 DIAGNOSIS — I10 Essential (primary) hypertension: Secondary | ICD-10-CM | POA: Diagnosis not present

## 2022-04-11 DIAGNOSIS — Z5181 Encounter for therapeutic drug level monitoring: Secondary | ICD-10-CM | POA: Diagnosis not present

## 2022-04-11 DIAGNOSIS — Z79899 Other long term (current) drug therapy: Secondary | ICD-10-CM | POA: Diagnosis not present

## 2022-04-19 DIAGNOSIS — E538 Deficiency of other specified B group vitamins: Secondary | ICD-10-CM | POA: Diagnosis not present

## 2022-04-24 DIAGNOSIS — H10503 Unspecified blepharoconjunctivitis, bilateral: Secondary | ICD-10-CM | POA: Diagnosis not present

## 2022-05-21 DIAGNOSIS — E538 Deficiency of other specified B group vitamins: Secondary | ICD-10-CM | POA: Diagnosis not present

## 2022-06-21 DIAGNOSIS — E538 Deficiency of other specified B group vitamins: Secondary | ICD-10-CM | POA: Diagnosis not present

## 2022-07-23 DIAGNOSIS — E538 Deficiency of other specified B group vitamins: Secondary | ICD-10-CM | POA: Diagnosis not present

## 2022-08-21 DIAGNOSIS — E538 Deficiency of other specified B group vitamins: Secondary | ICD-10-CM | POA: Diagnosis not present

## 2022-09-07 DIAGNOSIS — E538 Deficiency of other specified B group vitamins: Secondary | ICD-10-CM | POA: Diagnosis not present

## 2022-09-07 DIAGNOSIS — I10 Essential (primary) hypertension: Secondary | ICD-10-CM | POA: Diagnosis not present

## 2022-09-07 DIAGNOSIS — D509 Iron deficiency anemia, unspecified: Secondary | ICD-10-CM | POA: Diagnosis not present

## 2022-09-07 DIAGNOSIS — Z09 Encounter for follow-up examination after completed treatment for conditions other than malignant neoplasm: Secondary | ICD-10-CM | POA: Diagnosis not present

## 2022-09-07 DIAGNOSIS — J301 Allergic rhinitis due to pollen: Secondary | ICD-10-CM | POA: Diagnosis not present

## 2022-09-07 DIAGNOSIS — I480 Paroxysmal atrial fibrillation: Secondary | ICD-10-CM | POA: Diagnosis not present

## 2022-09-07 DIAGNOSIS — E871 Hypo-osmolality and hyponatremia: Secondary | ICD-10-CM | POA: Diagnosis not present

## 2022-09-21 DIAGNOSIS — E538 Deficiency of other specified B group vitamins: Secondary | ICD-10-CM | POA: Diagnosis not present

## 2022-10-22 DIAGNOSIS — E538 Deficiency of other specified B group vitamins: Secondary | ICD-10-CM | POA: Diagnosis not present

## 2022-11-22 DIAGNOSIS — E538 Deficiency of other specified B group vitamins: Secondary | ICD-10-CM | POA: Diagnosis not present

## 2022-12-24 DIAGNOSIS — E538 Deficiency of other specified B group vitamins: Secondary | ICD-10-CM | POA: Diagnosis not present

## 2023-01-02 DIAGNOSIS — I10 Essential (primary) hypertension: Secondary | ICD-10-CM | POA: Diagnosis not present

## 2023-01-02 DIAGNOSIS — Z7901 Long term (current) use of anticoagulants: Secondary | ICD-10-CM | POA: Diagnosis not present

## 2023-01-02 DIAGNOSIS — I340 Nonrheumatic mitral (valve) insufficiency: Secondary | ICD-10-CM | POA: Diagnosis not present

## 2023-01-02 DIAGNOSIS — I480 Paroxysmal atrial fibrillation: Secondary | ICD-10-CM | POA: Diagnosis not present

## 2023-01-14 DIAGNOSIS — I480 Paroxysmal atrial fibrillation: Secondary | ICD-10-CM | POA: Diagnosis not present

## 2023-01-24 DIAGNOSIS — E538 Deficiency of other specified B group vitamins: Secondary | ICD-10-CM | POA: Diagnosis not present

## 2023-03-04 DIAGNOSIS — E538 Deficiency of other specified B group vitamins: Secondary | ICD-10-CM | POA: Diagnosis not present

## 2023-03-06 DIAGNOSIS — H2513 Age-related nuclear cataract, bilateral: Secondary | ICD-10-CM | POA: Diagnosis not present

## 2023-03-25 DIAGNOSIS — M79674 Pain in right toe(s): Secondary | ICD-10-CM | POA: Diagnosis not present

## 2023-03-25 DIAGNOSIS — B351 Tinea unguium: Secondary | ICD-10-CM | POA: Diagnosis not present

## 2023-03-25 DIAGNOSIS — M79675 Pain in left toe(s): Secondary | ICD-10-CM | POA: Diagnosis not present

## 2023-04-03 DIAGNOSIS — E538 Deficiency of other specified B group vitamins: Secondary | ICD-10-CM | POA: Diagnosis not present

## 2023-04-11 DIAGNOSIS — Z23 Encounter for immunization: Secondary | ICD-10-CM | POA: Diagnosis not present

## 2023-04-11 DIAGNOSIS — E538 Deficiency of other specified B group vitamins: Secondary | ICD-10-CM | POA: Diagnosis not present

## 2023-04-11 DIAGNOSIS — I480 Paroxysmal atrial fibrillation: Secondary | ICD-10-CM | POA: Diagnosis not present

## 2023-04-11 DIAGNOSIS — I10 Essential (primary) hypertension: Secondary | ICD-10-CM | POA: Diagnosis not present

## 2023-05-06 DIAGNOSIS — E538 Deficiency of other specified B group vitamins: Secondary | ICD-10-CM | POA: Diagnosis not present

## 2023-06-06 DIAGNOSIS — E538 Deficiency of other specified B group vitamins: Secondary | ICD-10-CM | POA: Diagnosis not present

## 2023-06-10 DIAGNOSIS — I340 Nonrheumatic mitral (valve) insufficiency: Secondary | ICD-10-CM | POA: Diagnosis not present

## 2023-06-10 DIAGNOSIS — Z7901 Long term (current) use of anticoagulants: Secondary | ICD-10-CM | POA: Diagnosis not present

## 2023-06-10 DIAGNOSIS — I480 Paroxysmal atrial fibrillation: Secondary | ICD-10-CM | POA: Diagnosis not present

## 2023-06-10 DIAGNOSIS — I10 Essential (primary) hypertension: Secondary | ICD-10-CM | POA: Diagnosis not present

## 2023-07-08 DIAGNOSIS — E538 Deficiency of other specified B group vitamins: Secondary | ICD-10-CM | POA: Diagnosis not present

## 2023-07-09 DIAGNOSIS — L578 Other skin changes due to chronic exposure to nonionizing radiation: Secondary | ICD-10-CM | POA: Diagnosis not present

## 2023-07-09 DIAGNOSIS — D0359 Melanoma in situ of other part of trunk: Secondary | ICD-10-CM | POA: Diagnosis not present

## 2023-07-09 DIAGNOSIS — D2262 Melanocytic nevi of left upper limb, including shoulder: Secondary | ICD-10-CM | POA: Diagnosis not present

## 2023-07-09 DIAGNOSIS — L570 Actinic keratosis: Secondary | ICD-10-CM | POA: Diagnosis not present

## 2023-07-09 DIAGNOSIS — D225 Melanocytic nevi of trunk: Secondary | ICD-10-CM | POA: Diagnosis not present

## 2023-07-09 DIAGNOSIS — L821 Other seborrheic keratosis: Secondary | ICD-10-CM | POA: Diagnosis not present

## 2023-07-09 DIAGNOSIS — D485 Neoplasm of uncertain behavior of skin: Secondary | ICD-10-CM | POA: Diagnosis not present

## 2023-07-09 DIAGNOSIS — D2272 Melanocytic nevi of left lower limb, including hip: Secondary | ICD-10-CM | POA: Diagnosis not present

## 2023-07-09 DIAGNOSIS — D2271 Melanocytic nevi of right lower limb, including hip: Secondary | ICD-10-CM | POA: Diagnosis not present

## 2023-07-09 DIAGNOSIS — D2261 Melanocytic nevi of right upper limb, including shoulder: Secondary | ICD-10-CM | POA: Diagnosis not present

## 2023-08-05 DIAGNOSIS — D0359 Melanoma in situ of other part of trunk: Secondary | ICD-10-CM | POA: Diagnosis not present

## 2023-08-12 DIAGNOSIS — E538 Deficiency of other specified B group vitamins: Secondary | ICD-10-CM | POA: Diagnosis not present

## 2023-09-12 DIAGNOSIS — E538 Deficiency of other specified B group vitamins: Secondary | ICD-10-CM | POA: Diagnosis not present

## 2023-10-14 DIAGNOSIS — E538 Deficiency of other specified B group vitamins: Secondary | ICD-10-CM | POA: Diagnosis not present

## 2023-10-17 DIAGNOSIS — E538 Deficiency of other specified B group vitamins: Secondary | ICD-10-CM | POA: Diagnosis not present

## 2023-10-17 DIAGNOSIS — I10 Essential (primary) hypertension: Secondary | ICD-10-CM | POA: Diagnosis not present

## 2023-10-17 DIAGNOSIS — Z1321 Encounter for screening for nutritional disorder: Secondary | ICD-10-CM | POA: Diagnosis not present

## 2023-10-17 DIAGNOSIS — Z13220 Encounter for screening for lipoid disorders: Secondary | ICD-10-CM | POA: Diagnosis not present

## 2023-10-17 DIAGNOSIS — I480 Paroxysmal atrial fibrillation: Secondary | ICD-10-CM | POA: Diagnosis not present

## 2023-10-17 DIAGNOSIS — R7303 Prediabetes: Secondary | ICD-10-CM | POA: Diagnosis not present

## 2023-11-14 DIAGNOSIS — E538 Deficiency of other specified B group vitamins: Secondary | ICD-10-CM | POA: Diagnosis not present

## 2023-12-16 DIAGNOSIS — E538 Deficiency of other specified B group vitamins: Secondary | ICD-10-CM | POA: Diagnosis not present

## 2024-01-16 DIAGNOSIS — E538 Deficiency of other specified B group vitamins: Secondary | ICD-10-CM | POA: Diagnosis not present

## 2024-02-17 DIAGNOSIS — Z85828 Personal history of other malignant neoplasm of skin: Secondary | ICD-10-CM | POA: Diagnosis not present

## 2024-02-17 DIAGNOSIS — D2261 Melanocytic nevi of right upper limb, including shoulder: Secondary | ICD-10-CM | POA: Diagnosis not present

## 2024-02-17 DIAGNOSIS — D225 Melanocytic nevi of trunk: Secondary | ICD-10-CM | POA: Diagnosis not present

## 2024-02-17 DIAGNOSIS — Z86006 Personal history of melanoma in-situ: Secondary | ICD-10-CM | POA: Diagnosis not present

## 2024-02-17 DIAGNOSIS — D2272 Melanocytic nevi of left lower limb, including hip: Secondary | ICD-10-CM | POA: Diagnosis not present

## 2024-02-17 DIAGNOSIS — L570 Actinic keratosis: Secondary | ICD-10-CM | POA: Diagnosis not present

## 2024-02-17 DIAGNOSIS — D2262 Melanocytic nevi of left upper limb, including shoulder: Secondary | ICD-10-CM | POA: Diagnosis not present

## 2024-02-18 DIAGNOSIS — E538 Deficiency of other specified B group vitamins: Secondary | ICD-10-CM | POA: Diagnosis not present

## 2024-02-19 DIAGNOSIS — Z7901 Long term (current) use of anticoagulants: Secondary | ICD-10-CM | POA: Diagnosis not present

## 2024-02-19 DIAGNOSIS — I10 Essential (primary) hypertension: Secondary | ICD-10-CM | POA: Diagnosis not present

## 2024-02-19 DIAGNOSIS — I340 Nonrheumatic mitral (valve) insufficiency: Secondary | ICD-10-CM | POA: Diagnosis not present

## 2024-02-19 DIAGNOSIS — I480 Paroxysmal atrial fibrillation: Secondary | ICD-10-CM | POA: Diagnosis not present

## 2024-03-19 DIAGNOSIS — E538 Deficiency of other specified B group vitamins: Secondary | ICD-10-CM | POA: Diagnosis not present

## 2024-07-07 NOTE — Progress Notes (Signed)
 Subjective:    Chief Complaint  Patient presents with   Generalized Body Aches    X 1/30 Pt having productive cough, nasal congestion, scratchy throat, fatigued, body aches/chills, loss of appetite Denies chest pain, SOB, n/v/d, fever Taking Tylenol , last dose last night    Fatigue     Timothy Gilbert is a 79 y.o. male who presents for an established patient office visit for evaluation of influenza like symptoms. Symptoms include chills, headache, myalgias, productive cough, and sinus and nasal congestion and have been present for 3 days. He has tried to alleviate the symptoms with acetaminophen  with minimal relief. High risk factors for influenza complications: age greater than 21 years of age and co-morbid illness.  The pt has not had a flu shot. The patient does not havea history of asthma.  Patient complains his primary symptom is fatigue He also has a poor appetite  The following portions of the patient's history were reviewed and updated as appropriate.  Review of Systems Denies CP, SOB, nausea, HA     Objective:    General appearance: alert, appears stated age, cooperative, fatigued, and moderate distress Head: Normocephalic, without obvious abnormality, atraumatic Eyes: negative findings: lids and lashes normal, conjunctivae and sclerae normal, and corneas clear Ears: abnormal TM right ear - erythematous and abnormal TM left ear - erythematous Nose: copious and yellow discharge, moderate congestion, turbinates red and swollen Throat: abnormal findings: moderate oropharyngeal erythema and posterior lymphoid hyperplasia Neck: mild anterior cervical adenopathy Lungs: clear to auscultation bilaterally Heart: regular rate and rhythm, S1, S2 normal, no murmur, click, rub or gallop      Assessment:    COVID-19  Aware of interaction with flecanide, instructed to take half his usual dose Patient looks unwell, informed patient and spouse, low threshold to present to the ED    Plan:    Antivirals per orders.

## 2024-07-09 ENCOUNTER — Other Ambulatory Visit: Payer: Self-pay

## 2024-07-09 ENCOUNTER — Inpatient Hospital Stay: Admission: EM | Admit: 2024-07-09 | Source: Home / Self Care | Attending: Family Medicine | Admitting: Family Medicine

## 2024-07-09 DIAGNOSIS — I10 Essential (primary) hypertension: Secondary | ICD-10-CM

## 2024-07-09 DIAGNOSIS — H01009 Unspecified blepharitis unspecified eye, unspecified eyelid: Secondary | ICD-10-CM

## 2024-07-09 DIAGNOSIS — E876 Hypokalemia: Secondary | ICD-10-CM

## 2024-07-09 DIAGNOSIS — I480 Paroxysmal atrial fibrillation: Secondary | ICD-10-CM

## 2024-07-09 DIAGNOSIS — U071 COVID-19: Principal | ICD-10-CM

## 2024-07-09 DIAGNOSIS — E871 Hypo-osmolality and hyponatremia: Secondary | ICD-10-CM | POA: Diagnosis present

## 2024-07-09 NOTE — ED Triage Notes (Signed)
 Pt BIB [facility] from home. Dx with Covid on Tuesday, on Paxlovid. Pt c/o weakness and inability to get up since yesterday

## 2024-07-10 ENCOUNTER — Inpatient Hospital Stay

## 2024-07-10 ENCOUNTER — Emergency Department

## 2024-07-10 DIAGNOSIS — E871 Hypo-osmolality and hyponatremia: Secondary | ICD-10-CM | POA: Diagnosis present

## 2024-07-10 DIAGNOSIS — U071 COVID-19: Secondary | ICD-10-CM

## 2024-07-10 DIAGNOSIS — I480 Paroxysmal atrial fibrillation: Secondary | ICD-10-CM

## 2024-07-10 DIAGNOSIS — H01009 Unspecified blepharitis unspecified eye, unspecified eyelid: Secondary | ICD-10-CM

## 2024-07-10 DIAGNOSIS — I10 Essential (primary) hypertension: Secondary | ICD-10-CM

## 2024-07-10 DIAGNOSIS — E876 Hypokalemia: Secondary | ICD-10-CM

## 2024-07-10 LAB — URINALYSIS, ROUTINE W REFLEX MICROSCOPIC
Bacteria, UA: NONE SEEN
Bilirubin Urine: NEGATIVE
Glucose, UA: NEGATIVE mg/dL
Ketones, ur: 5 mg/dL — AB
Leukocytes,Ua: NEGATIVE
Nitrite: NEGATIVE
Protein, ur: NEGATIVE mg/dL
Specific Gravity, Urine: 1.014 (ref 1.005–1.030)
pH: 6 (ref 5.0–8.0)

## 2024-07-10 LAB — TSH: TSH: 2.56 u[IU]/mL (ref 0.350–4.500)

## 2024-07-10 LAB — CBC WITH DIFFERENTIAL/PLATELET
Abs Immature Granulocytes: 0.1 10*3/uL — ABNORMAL HIGH (ref 0.00–0.07)
Basophils Absolute: 0 10*3/uL (ref 0.0–0.1)
Basophils Relative: 0 %
Eosinophils Absolute: 0 10*3/uL (ref 0.0–0.5)
Eosinophils Relative: 0 %
HCT: 34 % — ABNORMAL LOW (ref 39.0–52.0)
Hemoglobin: 12.8 g/dL — ABNORMAL LOW (ref 13.0–17.0)
Immature Granulocytes: 1 %
Lymphocytes Relative: 7 %
Lymphs Abs: 0.9 10*3/uL (ref 0.7–4.0)
MCH: 31.6 pg (ref 26.0–34.0)
MCHC: 37.6 g/dL — ABNORMAL HIGH (ref 30.0–36.0)
MCV: 84 fL (ref 80.0–100.0)
Monocytes Absolute: 1 10*3/uL (ref 0.1–1.0)
Monocytes Relative: 8 %
Neutro Abs: 11.6 10*3/uL — ABNORMAL HIGH (ref 1.7–7.7)
Neutrophils Relative %: 84 %
Platelets: 263 10*3/uL (ref 150–400)
RBC: 4.05 MIL/uL — ABNORMAL LOW (ref 4.22–5.81)
RDW: 11.2 % — ABNORMAL LOW (ref 11.5–15.5)
Smear Review: NORMAL
WBC: 13.6 10*3/uL — ABNORMAL HIGH (ref 4.0–10.5)
nRBC: 0 % (ref 0.0–0.2)

## 2024-07-10 LAB — BASIC METABOLIC PANEL WITH GFR
Anion gap: 12 (ref 5–15)
Anion gap: 12 (ref 5–15)
BUN: 18 mg/dL (ref 8–23)
BUN: 15 mg/dL (ref 8–23)
CO2: 28 mmol/L (ref 22–32)
CO2: 26 mmol/L (ref 22–32)
Calcium: 8.4 mg/dL — ABNORMAL LOW (ref 8.9–10.3)
Calcium: 8.1 mg/dL — ABNORMAL LOW (ref 8.9–10.3)
Chloride: 74 mmol/L — ABNORMAL LOW (ref 98–111)
Chloride: 76 mmol/L — ABNORMAL LOW (ref 98–111)
Creatinine, Ser: 0.63 mg/dL (ref 0.61–1.24)
Creatinine, Ser: 0.59 mg/dL — ABNORMAL LOW (ref 0.61–1.24)
GFR, Estimated: >60 mL/min
GFR, Estimated: >60 mL/min
Glucose, Bld: 137 mg/dL — ABNORMAL HIGH (ref 70–99)
Glucose, Bld: 113 mg/dL — ABNORMAL HIGH (ref 70–99)
Potassium: 2.2 mmol/L — CL (ref 3.5–5.1)
Potassium: 2.6 mmol/L — CL (ref 3.5–5.1)
Sodium: 115 mmol/L — CL (ref 135–145)
Sodium: 114 mmol/L — CL (ref 135–145)

## 2024-07-10 LAB — SODIUM, URINE, RANDOM
Sodium, Ur: 85 mmol/L
Sodium, Ur: 86 mmol/L

## 2024-07-10 LAB — CBC
HCT: 34.4 % — ABNORMAL LOW (ref 39.0–52.0)
Hemoglobin: 12.8 g/dL — ABNORMAL LOW (ref 13.0–17.0)
MCH: 31.6 pg (ref 26.0–34.0)
MCHC: 37.2 g/dL — ABNORMAL HIGH (ref 30.0–36.0)
MCV: 84.9 fL (ref 80.0–100.0)
Platelets: 291 10*3/uL (ref 150–400)
RBC: 4.05 MIL/uL — ABNORMAL LOW (ref 4.22–5.81)
RDW: 11.2 % — ABNORMAL LOW (ref 11.5–15.5)
WBC: 14.5 10*3/uL — ABNORMAL HIGH (ref 4.0–10.5)
nRBC: 0 % (ref 0.0–0.2)

## 2024-07-10 LAB — NA AND K (SODIUM & POTASSIUM), RAND UR
Potassium Urine: 46 mmol/L
Sodium, Ur: 85 mmol/L

## 2024-07-10 LAB — MAGNESIUM: Magnesium: 1.7 mg/dL (ref 1.7–2.4)

## 2024-07-10 LAB — SODIUM
Sodium: 114 mmol/L — CL (ref 135–145)
Sodium: 114 mmol/L — CL (ref 135–145)
Sodium: 118 mmol/L — CL (ref 135–145)

## 2024-07-10 LAB — URIC ACID: Uric Acid, Serum: 4.2 mg/dL (ref 3.7–8.6)

## 2024-07-10 LAB — PHOSPHORUS: Phosphorus: 1.9 mg/dL — ABNORMAL LOW (ref 2.5–4.6)

## 2024-07-10 LAB — CORTISOL: Cortisol, Plasma: 23 ug/dL

## 2024-07-10 LAB — OSMOLALITY
Osmolality: 238 mosm/kg — CL (ref 275–295)
Osmolality: 244 mosm/kg — CL (ref 275–295)

## 2024-07-10 LAB — OSMOLALITY, URINE
Osmolality, Ur: 541 mosm/kg (ref 300–900)
Osmolality, Ur: 537 mosm/kg (ref 300–900)

## 2024-07-10 LAB — CREATININE, URINE, RANDOM: Creatinine, Urine: 90 mg/dL

## 2024-07-10 MED ORDER — TRAZODONE HCL 50 MG PO TABS: 25.0000 mg | ORAL_TABLET | Freq: Every evening | ORAL | Status: AC | PRN

## 2024-07-10 MED ORDER — ONDANSETRON HCL 4 MG PO TABS: 4.0000 mg | ORAL_TABLET | Freq: Four times a day (QID) | ORAL | Status: AC | PRN

## 2024-07-10 MED ORDER — ENOXAPARIN SODIUM 40 MG/0.4ML IJ SOSY
40.0000 mg | PREFILLED_SYRINGE | INTRAMUSCULAR | Status: AC
  Administered 2024-07-10: 40 mg via SUBCUTANEOUS
  Filled 2024-07-10: qty 0.4

## 2024-07-10 MED ORDER — SODIUM CHLORIDE 0.9 % IV BOLUS
500.0000 mL | Freq: Once | INTRAVENOUS | Status: AC
  Administered 2024-07-10: 500 mL via INTRAVENOUS

## 2024-07-10 MED ORDER — ONDANSETRON HCL 4 MG/2ML IJ SOLN: 4.0000 mg | Freq: Four times a day (QID) | INTRAMUSCULAR | Status: AC | PRN

## 2024-07-10 MED ORDER — FLECAINIDE ACETATE 100 MG PO TABS
100.0000 mg | ORAL_TABLET | Freq: Two times a day (BID) | ORAL | Status: AC
  Administered 2024-07-10 (×2): 100 mg via ORAL
  Filled 2024-07-10 (×2): qty 1

## 2024-07-10 MED ORDER — HYDRALAZINE HCL 20 MG/ML IJ SOLN: 10.0000 mg | Freq: Four times a day (QID) | INTRAMUSCULAR | Status: AC | PRN

## 2024-07-10 MED ORDER — TOBRAMYCIN 0.3 % OP OINT: TOPICAL_OINTMENT | Freq: Three times a day (TID) | OPHTHALMIC | Status: DC

## 2024-07-10 MED ORDER — ATENOLOL 25 MG PO TABS
50.0000 mg | ORAL_TABLET | Freq: Every day | ORAL | Status: AC
  Administered 2024-07-10: 50 mg via ORAL
  Filled 2024-07-10: qty 2

## 2024-07-10 MED ORDER — ADULT MULTIVITAMIN W/MINERALS CH
1.0000 | ORAL_TABLET | Freq: Every day | ORAL | Status: AC
  Administered 2024-07-10: 1 via ORAL
  Filled 2024-07-10: qty 1

## 2024-07-10 MED ORDER — SODIUM CHLORIDE 0.9 % IV SOLN: INTRAVENOUS | Status: DC

## 2024-07-10 MED ORDER — IOHEXOL 300 MG/ML  SOLN
75.0000 mL | Freq: Once | INTRAMUSCULAR | Status: AC | PRN
  Administered 2024-07-10: 75 mL via INTRAVENOUS

## 2024-07-10 MED ORDER — SODIUM CHLORIDE 3 % IV SOLN
INTRAVENOUS | Status: AC
  Filled 2024-07-10 (×2): qty 500

## 2024-07-10 MED ORDER — ACETAMINOPHEN 325 MG PO TABS: 650.0000 mg | ORAL_TABLET | Freq: Four times a day (QID) | ORAL | Status: AC | PRN

## 2024-07-10 MED ORDER — POTASSIUM CHLORIDE CRYS ER 20 MEQ PO TBCR
40.0000 meq | EXTENDED_RELEASE_TABLET | ORAL | Status: AC
  Administered 2024-07-10 (×2): 40 meq via ORAL
  Filled 2024-07-10 (×2): qty 2

## 2024-07-10 MED ORDER — ACETAMINOPHEN 325 MG RE SUPP: 650.0000 mg | Freq: Four times a day (QID) | RECTAL | Status: AC | PRN

## 2024-07-10 MED ORDER — TOBRAMYCIN-DEXAMETHASONE 0.3-0.1 % OP OINT
1.0000 | TOPICAL_OINTMENT | Freq: Three times a day (TID) | OPHTHALMIC | Status: AC
  Administered 2024-07-10 (×3): 1 via OPHTHALMIC
  Filled 2024-07-10: qty 3.5

## 2024-07-10 MED ORDER — POTASSIUM CHLORIDE CRYS ER 20 MEQ PO TBCR
40.0000 meq | EXTENDED_RELEASE_TABLET | Freq: Once | ORAL | Status: AC
  Administered 2024-07-10: 40 meq via ORAL
  Filled 2024-07-10: qty 2

## 2024-07-10 MED ORDER — LABETALOL HCL 5 MG/ML IV SOLN: 20.0000 mg | INTRAVENOUS | Status: AC | PRN

## 2024-07-10 MED ORDER — POTASSIUM CHLORIDE 10 MEQ/100ML IV SOLN
10.0000 meq | INTRAVENOUS | Status: AC
  Administered 2024-07-10 (×6): 10 meq via INTRAVENOUS
  Filled 2024-07-10 (×6): qty 100

## 2024-07-10 MED ORDER — MAGNESIUM HYDROXIDE 400 MG/5ML PO SUSP: 30.0000 mL | Freq: Every day | ORAL | Status: AC | PRN

## 2024-07-10 NOTE — Assessment & Plan Note (Signed)
-   Supportive management will be provided. - Will place on scheduled Mucinex and hydration as mentioned above. - He is maintaining his oxygen saturation and chest x-ray was negative.

## 2024-07-10 NOTE — Progress Notes (Signed)
 PHARMACY CONSULT NOTE - Hypertonic Saline  Pharmacy Consult for Hypertonic Saline Monitoring and Management  Recent Labs: Potassium (mmol/L)  Date Value  07/10/2024 2.6 (LL)   Magnesium  (mg/dL)  Date Value  97/94/7973 1.7   Calcium (mg/dL)  Date Value  97/93/7973 8.1 (L)   Phosphorus (mg/dL)  Date Value  97/94/7973 1.9 (L)   Sodium (mmol/L)  Date Value  07/10/2024 114 (LL)    Assessment  Eliodoro Gullett Kelm is a 79 y.o. male presenting with generalized weakness, poor oral intake. PMH significant for paroxysmal atrial fibrillation, essential hypertension, and recently diagnosed COVID-19 4 days ago. Pharmacy has been consulted to monitor hypertonic saline (3%) infusion.  Pertinent medications: N/A  Baseline Labs: Serum Na 114, Serum Osm 244, Urine Na IP, Urine Osm 537  Goal of Therapy:  Increase in Na by 4-6 mEq/L in 4-6 hours Do not exceed increase in Na by 10-12 mEq/L in 24 hours  Monitoring:  Date Time Na Rate/Comment  0206 1100 114 - 0206 1143 - HTS initiated @ 30 mL/hr  Plan:  Initiate 3% saline at 30 mL/hr Check Na Q2H x2 then Q4H  Stop infusion if  Na increases > 4 mEq/L in first 2 hours Na increases > 6 mEq/L in first 4 hours Na increases > 6 mEq/L in 6 hours  Na increases > 8 mEq/L in 8 hours (give D5W bolus) Continue to monitor for signs of clinical improvement and recommendations per nephrology  Thank you for involving pharmacy in this patient's care.   Damien Napoleon, PharmD Clinical Pharmacist 07/10/2024 10:54 AM

## 2024-07-10 NOTE — Consult Note (Signed)
 " [facility]  CONSULT NOTE    Date: 07/10/2024                  Patient Name:  Timothy Gilbert  MRN: [id number]  DOB: Aug 17, 1945  Age / Sex: 79 y.o., male         PCP: Mylinda Lenis, MD                 Service Requesting Consult: TRH                 Reason for Consult: Hyponatremia            History of Present Illness: Mr. Timothy Gilbert is a 79 y.o.  male with past medical conditions including hypertension, paroxysmal atrial fibrillation, and recent COVID-19 infection, who was admitted to [facility] on 07/09/2024 for Hyponatremia [E87.1]  Patient presents to the emergency department with weakness.  Patient is seen resting on stretcher, states he was diagnosed with COVID-19 4 days prior.  States he has continued to have the weakness, fatigue, and poor oral intake.  Does state he has tried to remain hydrated with water and other juices.  Denies NSAID use.  Denies tobacco use.  Labs on ED arrival concerning for sodium 115, potassium 2.2, serum osmolality 238, white count 13.6 with hemoglobin 12.8.  Chest x-ray negative for acute findings.  Normal saline boluses received during emergency department stay, sodium remains 114.   Medications: Outpatient medications: (Not in a hospital admission)   Current medications: Current Facility-Administered Medications  Medication Dose Route Frequency Provider Last Rate Last Admin   acetaminophen  (TYLENOL ) tablet 650 mg  650 mg Oral Q6H PRN Mansy, Jan A, MD       Or   acetaminophen  (TYLENOL ) suppository 650 mg  650 mg Rectal Q6H PRN Mansy, Jan A, MD       atenolol  (TENORMIN ) tablet 50 mg  50 mg Oral Daily Mansy, Jan A, MD   50 mg at 07/10/24 1141   enoxaparin  (LOVENOX ) injection 40 mg  40 mg Subcutaneous Q24H Mansy, Jan A, MD   40 mg at 07/10/24 1007   flecainide  (TAMBOCOR ) tablet 100 mg  100 mg Oral BID Mansy, Jan A, MD   100 mg at 07/10/24 1007   hydrALAZINE  (APRESOLINE ) injection 10 mg  10 mg Intravenous Q6H PRN Mansy, Jan A, MD        labetalol  (NORMODYNE ) injection 20 mg  20 mg Intravenous Q3H PRN Mansy, Jan A, MD       magnesium  hydroxide (MILK OF MAGNESIA) suspension 30 mL  30 mL Oral Daily PRN Mansy, Jan A, MD       multivitamin with minerals tablet 1 tablet  1 tablet Oral Daily Mansy, Jan A, MD   1 tablet at 07/10/24 1007   ondansetron  (ZOFRAN ) tablet 4 mg  4 mg Oral Q6H PRN Mansy, Jan A, MD       Or   ondansetron  (ZOFRAN ) injection 4 mg  4 mg Intravenous Q6H PRN Mansy, Jan A, MD       sodium chloride  (hypertonic) 3 % solution   Intravenous Continuous Druscilla Bald, NP 30 mL/hr at 07/10/24 1143 New Bag at 07/10/24 1143   tobramycin -dexamethasone  (TOBRADEX ) ophthalmic ointment 1 Application  1 Application Right Eye TID Mansy, Jan A, MD   1 Application at 07/10/24 0753   traZODone  (DESYREL ) tablet 25 mg  25 mg Oral QHS PRN Mansy, Madison LABOR, MD       Current  Outpatient Medications  Medication Sig Dispense Refill   atenolol  (TENORMIN ) 50 MG tablet Take 50 mg by mouth daily.     ELIQUIS 5 MG TABS tablet Take 5 mg by mouth 2 (two) times daily.     flecainide  (TAMBOCOR ) 100 MG tablet Take 100 mg by mouth 2 (two) times daily.     fluticasone (FLONASE) 50 MCG/ACT nasal spray Place 2 sprays into the nose daily as needed for allergies or rhinitis.     ibuprofen (ADVIL,MOTRIN) 200 MG tablet Take 200 mg by mouth every 6 (six) hours as needed.     nirmatrelvir/ritonavir (PAXLOVID) 20 x 150 MG & 10 x 100MG  TBPK Take 3 tablets by mouth 2 (two) times daily.     valsartan-hydrochlorothiazide (DIOVAN-HCT) 320-25 MG tablet Take 1 tablet by mouth daily.     Multiple Vitamin (MULTIVITAMIN) tablet Take 1 tablet by mouth daily. (Patient not taking: Reported on 07/10/2024)        Allergies: Allergies[1]    Past Medical History: Past Medical History:  Diagnosis Date   Allergic rhinitis    Anemia    Atrial fibrillation (HCC)    B12 deficiency    DDD (degenerative disc disease), cervical    Degenerative disc disease, lumbar     Dysrhythmia    Hypertension       Family History: No family history on file.   Social History: Social History   Socioeconomic History   Marital status: Married    Spouse name: Not on file   Number of children: Not on file   Years of education: Not on file   Highest education level: Not on file  Occupational History   Not on file  Tobacco Use   Smoking status: Not on file   Smokeless tobacco: Not on file  Substance and Sexual Activity   Alcohol  use: Not on file   Drug use: Not on file   Sexual activity: Not on file  Other Topics Concern   Not on file  Social History Narrative   Not on file   Social Drivers of Health   Tobacco Use: Low Risk  (07/07/2024)   Received from [facility]   Patient History    Smoking Tobacco Use: Never    Smokeless Tobacco Use: Never    Passive Exposure: Not on file  Financial Resource Strain: Low Risk  (04/17/2024)   Received from [facility]   Overall Financial Resource Strain (CARDIA)    Difficulty of Paying Living Expenses: Not hard at all  Food Insecurity: No Food Insecurity (04/17/2024)   Received from [facility]   Epic    Within the past 12 months, you worried that your food would run out before you got the money to buy more.: Never true    Within the past 12 months, the food you bought just didn't last and you didn't have money to get more.: Never true  Transportation Needs: No Transportation Needs (04/17/2024)   Received from [facility] - Transportation    In the past 12 months, has lack of transportation kept you from medical appointments or from getting medications?: No    Lack of Transportation (Non-Medical): No  Physical Activity: Not on file  Stress: Not on file  Social Connections: Not on file  Intimate Partner Violence: Not on file  Depression (EYV7-0): Not on file  Alcohol  Screen: Not on file  Housing: Low Risk  (04/17/2024)    Received from  [facility]   Epic    In the last 12 months, was there a time when you were not able to pay the mortgage or rent on time?: No    In the past 12 months, how many times have you moved where you were living?: 0    At any time in the past 12 months, were you homeless or living in a shelter (including now)?: No  Utilities: Not At Risk (04/17/2024)   Received from [facility]   Epic    In the past 12 months has the electric, gas, oil, or water company threatened to shut off services in your home?: No  Health Literacy: Not on file     Review of Systems: Review of Systems  Constitutional:  Negative for chills, fever and malaise/fatigue.  HENT:  Negative for congestion, sore throat and tinnitus.   Eyes:  Negative for blurred vision and redness.  Respiratory:  Negative for cough, shortness of breath and wheezing.   Cardiovascular:  Negative for chest pain, palpitations, claudication and leg swelling.  Gastrointestinal:  Negative for abdominal pain, blood in stool, diarrhea, nausea and vomiting.  Genitourinary:  Negative for flank pain, frequency and hematuria.  Musculoskeletal:  Negative for back pain, falls and myalgias.  Skin:  Negative for rash.  Neurological:  Positive for weakness. Negative for dizziness and headaches.  Endo/Heme/Allergies:  Does not bruise/bleed easily.  Psychiatric/Behavioral:  Negative for depression. The patient is not nervous/anxious and does not have insomnia.     Vital Signs: Blood pressure (!) 147/74, pulse 77, temperature (!) 97.5 F (36.4 C), temperature source Oral, resp. rate 20, height 6' 3 (1.905 m), weight 88.5 kg, SpO2 96%.  Weight trends: Filed Weights   07/09/24 2250  Weight: 88.5 kg    Physical Exam: General: NAD,   Head: Normocephalic, atraumatic. Moist oral mucosal membranes  Eyes: Anicteric  Lungs:  Clear to auscultation, normal effort  Heart: Regular rate and rhythm  Abdomen:  Soft,  nontender,   Extremities: No peripheral edema.  Neurologic: Awake and alert  Skin: No lesions  Access: None     Lab results: Basic Metabolic Panel: Recent Labs  Lab 07/09/24 2322 07/10/24 0529 07/10/24 1100 07/10/24 1400  NA 115* 114* 114* 114*  K 2.2* 2.6*  --   --   CL 74* 76*  --   --   CO2 28 26  --   --   GLUCOSE 137* 113*  --   --   BUN 18 15  --   --   CREATININE 0.63 0.59*  --   --   CALCIUM 8.4* 8.1*  --   --   MG 1.7  --   --   --   PHOS 1.9*  --   --   --     Liver Function Tests: No results for input(s): AST, ALT, ALKPHOS, BILITOT, PROT, ALBUMIN in the last 168 hours. No results for input(s): LIPASE, AMYLASE in the last 168 hours. No results for input(s): AMMONIA in the last 168 hours.  CBC: Recent Labs  Lab 07/09/24 2322 07/10/24 0529  WBC 13.6* 14.5*  NEUTROABS 11.6*  --   HGB 12.8* 12.8*  HCT 34.0* 34.4*  MCV 84.0 84.9  PLT 263 291    Cardiac Enzymes: No results for input(s): CKTOTAL, CKMB, CKMBINDEX, TROPONINI in the last 168 hours.  BNP: Invalid input(s): POCBNP  CBG: No results for input(s): GLUCAP in the last 168 hours.  Microbiology: Results  for orders placed or performed in visit on 06/10/19  Novel Coronavirus, NAA ([facility])     Status: None   Collection Time: 06/10/19 10:11 AM   Specimen: Nasopharyngeal(NP) swabs in vial transport medium   NASOPHARYNGE  TESTING  Result Value Ref Range Status   SARS-CoV-2, NAA Not Detected Not Detected Final    Comment: This nucleic acid amplification test was developed and its performance characteristics determined by [employer]. Nucleic acid amplification tests include PCR and TMA. This test has not been FDA cleared or approved. This test has been authorized by FDA under an Emergency Use Authorization (EUA). This test is only authorized for the duration of time the declaration that circumstances exist justifying the authorization of the emergency use of  in vitro diagnostic tests for detection of SARS-CoV-2 virus and/or diagnosis of COVID-19 infection under section 564(b)(1) of the Act, 21 U.S.C. 639aaa-6(a) (1), unless the authorization is terminated or revoked sooner. When diagnostic testing is negative, the possibility of a false negative result should be considered in the context of a patient's recent exposures and the presence of clinical signs and symptoms consistent with COVID-19. An individual without symptoms of COVID-19 and who is not shedding SARS-CoV-2 virus would  expect to have a negative (not detected) result in this assay.     Coagulation Studies: No results for input(s): LABPROT, INR in the last 72 hours.  Urinalysis: Recent Labs    07/10/24 0120  COLORURINE YELLOW*  LABSPEC 1.014  PHURINE 6.0  GLUCOSEU NEGATIVE  HGBUR SMALL*  BILIRUBINUR NEGATIVE  KETONESUR 5*  PROTEINUR NEGATIVE  NITRITE NEGATIVE  LEUKOCYTESUR NEGATIVE      Imaging: DG Chest Portable 1 View Result Date: 07/10/2024 EXAM: 1 VIEW(S) XRAY OF THE CHEST 07/10/2024 12:42:00 AM COMPARISON: None available. CLINICAL HISTORY: Cough and weakness; COVID-19. FINDINGS: LUNGS AND PLEURA: No focal pulmonary opacity. No pleural effusion. No pneumothorax. HEART AND MEDIASTINUM: No acute abnormality of the cardiac and mediastinal silhouettes. BONES AND SOFT TISSUES: No acute osseous abnormality. IMPRESSION: 1. No acute process. Electronically signed by: Pinkie Pebbles MD 07/10/2024 12:46 AM EST RP Workstation: [id number]     Assessment & Plan: Mr. Amontae Ng Abarca is a 79 y.o.  male with past medical conditions including hypertension, paroxysmal atrial fibrillation, and recent COVID-19 infection, who was admitted to [facility] on 07/09/2024 for Hyponatremia [E87.1]  Hyponatremia, acute.'s as sodium on ED arrival 115, currently 114.  Symptoms not relieved with IV fluids.  Likely secondary to SIADH versus volume depletion.  Due to severity of levels and seizure risk,  will initiate 3% hypertonic saline at 30 mL/h.  Will also obtain CT chest to rule out pulmonary involvement.  Will continue to monitor patient.  Goal of correction 8-10 points per 24 hours.  2.  Hypokalemia, potassium 2.2 on ED arrival, 2.6 today.  Likely also secondary to poor oral intake.  Primary team has ordered IV and oral supplementation.  3.  Hypertension, essential.  Currently receiving atenolol .   LOS: 0 Wilhelmenia Addis 2/6/20263:21 PM     [1]  Allergies Allergen Reactions   Amoxicillin-Pot Clavulanate Diarrhea   Amlodipine Swelling   Hydrochlorothiazide Other (See Comments)    hyponatremia   "

## 2024-07-10 NOTE — Progress Notes (Signed)
 PHARMACY CONSULT NOTE - Hypertonic Saline  Pharmacy Consult for Hypertonic Saline Monitoring and Management  Recent Labs: Potassium (mmol/L)  Date Value  07/10/2024 2.6 (LL)   Magnesium  (mg/dL)  Date Value  97/94/7973 1.7   Calcium (mg/dL)  Date Value  97/93/7973 8.1 (L)   Phosphorus (mg/dL)  Date Value  97/94/7973 1.9 (L)   Sodium (mmol/L)  Date Value  07/10/2024 114 (LL)    Assessment  Timothy Gilbert is a 79 y.o. male presenting with generalized weakness, poor oral intake. PMH significant for paroxysmal atrial fibrillation, essential hypertension, and recently diagnosed COVID-19 4 days ago. Pharmacy has been consulted to monitor hypertonic saline (3%) infusion.  Pertinent medications: N/A  Baseline Labs: Serum Na 114, Serum Osm 244, Urine Na IP, Urine Osm 537  Goal of Therapy:  Increase in Na by 4-6 mEq/L in 4-6 hours Do not exceed increase in Na by 10-12 mEq/L in 24 hours  Monitoring:  Date Time Na Rate/Comment  0206 1100 114 - 0206 1143 - HTS initiated @ 30 mL/hr 0206 1400 114 No change in 3 hours  Plan:  Initiate 3% saline at 30 mL/hr Check Na Q2H x2 then Q4H  Stop infusion if  Na increases > 4 mEq/L in first 2 hours Na increases > 6 mEq/L in first 4 hours Na increases > 6 mEq/L in 6 hours  Na increases > 8 mEq/L in 8 hours (give D5W bolus) Continue to monitor for signs of clinical improvement and recommendations per nephrology  Thank you for involving pharmacy in this patient's care.   Damien Napoleon, PharmD Clinical Pharmacist 07/10/2024 3:03 PM

## 2024-07-10 NOTE — Assessment & Plan Note (Signed)
 Potassium replacement will be provided.  Magnesium  will be optimized.

## 2024-07-10 NOTE — ED Notes (Signed)
 Second sodium level sent per pharmacist request.

## 2024-07-10 NOTE — Assessment & Plan Note (Signed)
-   The patient be admitted to a progressive unit bed. - We will continue admission with IV normal saline and limit p.o. fluid intake. - We will obtain hyponatremia workup. - I suspect that this is hypovolemic and due to his recent anorexia. - Will follow serial sodium levels. - Will avoid diuretics.

## 2024-07-10 NOTE — Progress Notes (Signed)
" °  PROGRESS NOTE    Timothy Gilbert  FMW:[id number] DOB: September 16, 1945 DOA: 07/09/2024 PCP: Mylinda Lenis, MD  [id number]  LOS: 0 days   Brief hospital course:   Assessment & Plan: Timothy Gilbert is a 79 y.o. Caucasian male with medical history significant for paroxysmal atrial fibrillation, essential hypertension, and recently diagnosed COVID-19 4 days ago, who presented to the emergency room with acute onset of worsening generalized weakness.  The patient has not been having much appetite for the last several days.    * Hyponatremia 2/2 SIADH --Na 115 on presentation, did not improve with IVF.  Urine studies consistent with SIADH. --d/c IVF --fluid restriction fo 1200 ml per day --Nephro consult today --start 3% saline infusion, per nephro  COVID-19 virus infection - He is maintaining his oxygen saturation and chest x-ray was negative.  No respiratory symptoms.  Hypokalemia --potassium 2.2 on presentation.   --Monitor and supplement PRN  Paroxysmal atrial fibrillation (HCC) - not on anticoagulation 2/2 being a high fall risk. - continue his flecainide  and atenolol .  Blepharitis - cont Tobradex  eye ointment   DVT prophylaxis: Lovenox  SQ Code Status: Full code  Family Communication: wife updated at bedside today Level of care: Stepdown Dispo:   The patient is from: home Anticipated d/c is to: home Anticipated d/c date is: > 3 days   Subjective and Interval History:  Pt reported feeling weak and no appetite.   Objective: Vitals:   07/10/24 1715 07/10/24 1800 07/10/24 1821 07/10/24 1830  BP:   134/83 134/84  Pulse: 77 70 86 79  Resp: 14 16 18 15   Temp:      TempSrc:      SpO2: 97% 99% 96% 96%  Weight:      Height:        Intake/Output Summary (Last 24 hours) at 07/10/2024 2024 Last data filed at 07/10/2024 9147 Gross per 24 hour  Intake 800 ml  Output 450 ml  Net 350 ml   Filed Weights   07/09/24 2250  Weight: 88.5 kg    Examination:   Constitutional: NAD,  alert, oriented HEENT: conjunctivae and lids normal, EOMI CV: No cyanosis.   RESP: normal respiratory effort, on RA Neuro: II - XII grossly intact.   Psych: Normal mood and affect.  Appropriate judgement and reason   Data Reviewed: I have personally reviewed labs and imaging studies  No charge note.  Ellouise Haber, MD [facility] If 7PM-7AM, please contact night-coverage 07/10/2024, 8:24 PM   "

## 2024-07-10 NOTE — ED Notes (Signed)
 Lab called to advise of critical: sodium 115 and potassium 2.2, provider advised of same

## 2024-07-10 NOTE — ED Provider Notes (Signed)
 "  [facility] Provider Note    Event Date/Time   First MD Initiated Contact with Patient 07/09/24 2303     (approximate)   History   Weakness   HPI Timothy Gilbert is a 79 y.o. male who presents for evaluation of generalized weakness.  He was diagnosed with COVID-19 at [facility] primary care about 3 days ago but he had had symptoms for several days before that.  He said he is steadily gotten worse and now is too weak to stand up or walk.  He has had a little bit of nausea but no vomiting and no diarrhea.  Decreased oral intake.  No chest pain or shortness of breath.  No fever of which he is aware.     Physical Exam   Triage Vital Signs: ED Triage Vitals  Encounter Vitals Group     BP 07/09/24 2237 (!) 143/84     Girls Systolic BP Percentile --      Girls Diastolic BP Percentile --      Boys Systolic BP Percentile --      Boys Diastolic BP Percentile --      Pulse Rate 07/09/24 2237 70     Resp 07/09/24 2237 14     Temp 07/09/24 2237 99 F (37.2 C)     Temp Source 07/09/24 2237 Oral     SpO2 07/09/24 2237 98 %     Weight 07/09/24 2250 88.5 kg (195 lb)     Height 07/09/24 2250 1.905 m (6' 3)     Head Circumference --      Peak Flow --      Pain Score 07/09/24 2243 0     Pain Loc --      Pain Education --      Exclude from Growth Chart --     Most recent vital signs: Vitals:   07/09/24 2237  BP: (!) 143/84  Pulse: 70  Resp: 14  Temp: 99 F (37.2 C)  SpO2: 98%    General: Awake, alert, conversant, pleasant, but ill-appearing.  Right eye is red with discharge but he said that this is normal and he sees his eye doctor regularly and it has been this [place] for years. CV:  Good peripheral perfusion.  Normal heart sounds. Resp:  Normal effort. Speaking easily and comfortably, no accessory muscle usage nor intercostal retractions.  Lungs clear to auscultation. Abd:  No distention.  No tenderness to palpation of the abdomen Other:  No focal neurological  deficits   ED Results / Procedures / Treatments   Labs (all labs ordered are listed, but only abnormal results are displayed) Labs Reviewed  BASIC METABOLIC PANEL WITH GFR - Abnormal; Notable for the following components:      Result Value   Sodium 115 (*)    Potassium 2.2 (*)    Chloride 74 (*)    Glucose, Bld 137 (*)    Calcium 8.4 (*)    All other components within normal limits  CBC WITH DIFFERENTIAL/PLATELET - Abnormal; Notable for the following components:   WBC 13.6 (*)    RBC 4.05 (*)    Hemoglobin 12.8 (*)    HCT 34.0 (*)    MCHC 37.6 (*)    RDW 11.2 (*)    Neutro Abs 11.6 (*)    Abs Immature Granulocytes 0.10 (*)    All other components within normal limits  PHOSPHORUS - Abnormal; Notable for the following components:   Phosphorus 1.9 (*)  All other components within normal limits  CREATININE, URINE, RANDOM  MAGNESIUM   TSH  URINALYSIS, ROUTINE W REFLEX MICROSCOPIC  SODIUM, URINE, RANDOM  OSMOLALITY, URINE  UREA NITROGEN, URINE  URIC ACID, RANDOM URINE  OSMOLALITY  URIC ACID  CORTISOL  CBC  CREATININE, SERUM  BASIC METABOLIC PANEL WITH GFR  CBC      RADIOLOGY See ED course for details   PROCEDURES:  Critical Care performed: Yes, see critical care procedure note(s)  .Critical Care  Performed by: Gordan Huxley, MD Authorized by: Gordan Huxley, MD   Critical care provider statement:    Critical care time (minutes):  30   Critical care time was exclusive of:  Separately billable procedures and treating other patients   Critical care was necessary to treat or prevent imminent or life-threatening deterioration of the following conditions:  Metabolic crisis   Critical care was time spent personally by me on the following activities:  Development of treatment plan with patient or surrogate, evaluation of patient's response to treatment, examination of patient, obtaining history from patient or surrogate, ordering and performing treatments and  interventions, ordering and review of laboratory studies, ordering and review of radiographic studies, pulse oximetry, re-evaluation of patient's condition and review of old charts     IMPRESSION / MDM / ASSESSMENT AND PLAN / ED COURSE  I reviewed the triage vital signs and the nursing notes.                              Differential diagnosis includes, but is not limited to, COVID-19, electrolyte or metabolic abnormality, pneumonia  Patient's presentation is most consistent with acute presentation with potential threat to life or bodily function.  Labs/studies ordered (see ED course for additional labs and studies that may have been added later): BMP, CBC with differential, magnesium , phosphorus, serum osmolality, uric acid, TSH, urine and urine electrolytes, cortisol level  Interventions/Medications given:  Medications  potassium chloride  10 mEq in 100 mL IVPB (10 mEq Intravenous New Bag/Given 07/10/24 0210)  enoxaparin  (LOVENOX ) injection 40 mg (has no administration in time range)  0.9 %  sodium chloride  infusion ( Intravenous New Bag/Given 07/10/24 0212)  acetaminophen  (TYLENOL ) tablet 650 mg (has no administration in time range)    Or  acetaminophen  (TYLENOL ) suppository 650 mg (has no administration in time range)  traZODone  (DESYREL ) tablet 25 mg (has no administration in time range)  magnesium  hydroxide (MILK OF MAGNESIA) suspension 30 mL (has no administration in time range)  ondansetron  (ZOFRAN ) tablet 4 mg (has no administration in time range)    Or  ondansetron  (ZOFRAN ) injection 4 mg (has no administration in time range)  sodium chloride  0.9 % bolus 500 mL (0 mLs Intravenous Stopped 07/10/24 0127)  potassium chloride  SA (KLOR-CON  M) CR tablet 40 mEq (40 mEq Oral Given 07/10/24 0206)    (Note:  hospital course my include additional interventions and/or labs/studies not listed above.)   Patient's vital signs are stable and he is not requiring oxygen but he is ill-appearing.   Labs are critical with a sodium of 115.  I have no recent labs but his last labs in care everywhere from a year or more ago were essentially normal with a very slightly low sodium in the 130s.  He appears euvolemic or possibly hypovolemic.  Given that he has no altered mental status I will not give hypertonic saline at this time and will allow for slow correction.  I am  initiating a small fluid bolus of 500 mL of normal saline as well as potassium repletion for a potassium of 2.2.  I am giving 40 mill equivalents by mouth and 10 mill equivalents by IV x 6 bags.  Phosphorus is pending, magnesium  is within normal limits.  I ordered additional labs to help guide therapy on the hospitalist service such as serum osmolality and urine electrolytes but these are not required for admission.  I will consult the admission team after seeing his chest x-ray.  Patient understands and agrees with the plan.     Clinical Course as of 07/10/24 0228  Fri Jul 10, 2024  0113 DG Chest Portable 1 View I independently viewed and interpreted the patient's chest x-ray(s), and I also reviewed the radiologist's report.  No acute abnormalities including no pneumonia [CF]  0113 I am consulting the hospitalist team for admission.   [CF]  0119 I consulted by phone with the admitting hospitalist, and they will admit the patient - Dr. Lawence [CF]    Clinical Course User Index [CF] Gordan Huxley, MD     FINAL CLINICAL IMPRESSION(S) / ED DIAGNOSES   Final diagnoses:  COVID-19  Hyponatremia  Hypokalemia     Rx / DC Orders   ED Discharge Orders     None        Note:  This document was prepared using Dragon voice recognition software and may include unintentional dictation errors.   Gordan Huxley, MD 07/10/24 [phone]  "

## 2024-07-10 NOTE — Assessment & Plan Note (Signed)
-   She is not on anticoagulation being a high fall risk. - Will continue his flecainide  and atenolol .

## 2024-07-10 NOTE — Assessment & Plan Note (Signed)
-   Will place him on tobramycin  ophthalmic ointment.

## 2024-07-10 NOTE — Assessment & Plan Note (Signed)
-   Will continue antihypertensive therapy while holding of diuretic therapy

## 2024-07-10 NOTE — ED Notes (Signed)
 Wife at bedside. Atenolol  not available yet from pharmacy.

## 2024-07-10 NOTE — ED Notes (Signed)
 Serum sodium is still 114, called by lab.

## 2024-07-10 NOTE — H&P (Addendum)
 "     [place]   PATIENT NAME: Timothy Gilbert    MR#:  [id number]  DATE OF BIRTH:  1946-05-14  DATE OF ADMISSION:  07/09/2024  PRIMARY CARE PHYSICIAN: Mylinda Lenis, MD   Patient is coming from: Home  REQUESTING/REFERRING PHYSICIAN: Gordan Huxley, MD  CHIEF COMPLAINT:   Chief Complaint  Patient presents with   Weakness    HISTORY OF PRESENT ILLNESS:  Timothy Gilbert is a 79 y.o. Caucasian male with medical history significant for paroxysmal atrial fibrillation, essential hypertension, and recently diagnosed COVID-19 4 days ago, who presented to the emergency room with acute onset of worsening generalized weakness.  The patient has not been having much appetite for the last several days.  He denied any nausea or vomiting or diarrhea.  He initially had cough which has improved without wheezing or dyspnea.  No chest pain or palpitations.  No fever or chills.  No dysuria, oliguria or hematuria or flank pain.  He has chronic right eye blepharitis.  ED Course: Upon arrival to the ER, BP was 143/84 with otherwise normal vital signs.  Labs revealed hyponatremia 115 and hypokalemia of 2.2 with calcium of 8.4 and phosphorus 1.9.  Magnesium  was 1.7 CBC showed leukocytosis 13.6 with neutrophilia and mild anemia.  UA was negative. EKG as reviewed by me : None. Imaging: Portable chest x-ray showed no acute cardiopulmonary disease.  The patient was given 500 mL IV normal saline bolus and 40 mill colons p.o. potassium chloride .  He will be admitted to a progressive unit bed for further evaluation and management. PAST MEDICAL HISTORY:   Past Medical History:  Diagnosis Date   Allergic rhinitis    Anemia    Atrial fibrillation (HCC)    B12 deficiency    DDD (degenerative disc disease), cervical    Degenerative disc disease, lumbar    Dysrhythmia    Hypertension     PAST SURGICAL HISTORY:  He has a past surgical history that includes Repair Inguinal Hernia (Left, 2008); Appendectomy (1960);  Colonoscopy (08/01/2015); and egd (08/01/2015).  SOCIAL HISTORY:   Social History   Tobacco Use   Smoking status: Not on file   Smokeless tobacco: Not on file  Substance Use Topics   Alcohol  use: Not on file  No history of tobacco reteach abuse or illicit drug use.  FAMILY HISTORY:  Positive for strep and his mother, heart failure and emphysema And MI in his sister as well as rheumatoid arthritis. DRUG ALLERGIES:  Allergies[1]  REVIEW OF SYSTEMS:   ROS As per history of present illness. All pertinent systems were reviewed above. Constitutional, HEENT, cardiovascular, respiratory, GI, GU, musculoskeletal, neuro, psychiatric, endocrine, integumentary and hematologic systems were reviewed and are otherwise negative/unremarkable except for positive findings mentioned above in the HPI.   MEDICATIONS AT HOME:   Prior to Admission medications  Medication Sig Start Date End Date Taking? Authorizing Provider  atenolol  (TENORMIN ) 50 MG tablet Take 50 mg by mouth daily.    [provider]  Cyanocobalamin  1000 MCG/ML KIT Inject as directed.    [provider]  flecainide  (TAMBOCOR ) 100 MG tablet Take 100 mg by mouth 2 (two) times daily.    [provider]  ibuprofen (ADVIL,MOTRIN) 200 MG tablet Take 200 mg by mouth every 6 (six) hours as needed.    [provider]  loratadine (CLARITIN) 10 MG tablet Take 10 mg by mouth daily.    [provider]  Multiple Vitamin (MULTIVITAMIN) tablet Take 1 tablet  by mouth daily.    [provider]  valsartan-hydrochlorothiazide (DIOVAN-HCT) 320-25 MG tablet Take 1 tablet by mouth daily.    [provider]      VITAL SIGNS:  Blood pressure (!) 156/84, pulse 77, temperature 99.2 F (37.3 C), temperature source Oral, resp. rate 15, height 6' 3 (1.905 m), weight 88.5 kg, SpO2 95%.  PHYSICAL EXAMINATION:  Physical Exam  GENERAL:  79 y.o.-year-old patient lying in the bed with no acute  distress.  EYES: Pupils equal, round, reactive to light and accommodation. No scleral icterus. Extraocular muscles intact.  Right lower lid erythema with mild ectropion consistent with his  blepharitis with with eyelashes. HEENT: Head atraumatic, normocephalic. Oropharynx and nasopharynx clear.  NECK:  Supple, no jugular venous distention. No thyroid  enlargement, no tenderness.  LUNGS: Normal breath sounds bilaterally, no wheezing, rales,rhonchi or crepitation. No use of accessory muscles of respiration.  CARDIOVASCULAR: Regular rate and rhythm, S1, S2 normal. No murmurs, rubs, or gallops.  ABDOMEN: Soft, nondistended, nontender. Bowel sounds present. No organomegaly or mass.  EXTREMITIES: No pedal edema, cyanosis, or clubbing.  NEUROLOGIC: Cranial nerves II through XII are intact. Muscle strength 5/5 in all extremities. Sensation intact. Gait not checked.  PSYCHIATRIC: The patient is alert and oriented x 3.  Normal affect and good eye contact. SKIN: No obvious rash, lesion, or ulcer.   LABORATORY PANEL:   CBC Recent Labs  Lab 07/09/24 2322  WBC 13.6*  HGB 12.8*  HCT 34.0*  PLT 263   ------------------------------------------------------------------------------------------------------------------  Chemistries  Recent Labs  Lab 07/09/24 2322  NA 115*  K 2.2*  CL 74*  CO2 28  GLUCOSE 137*  BUN 18  CREATININE 0.63  CALCIUM 8.4*  MG 1.7   ------------------------------------------------------------------------------------------------------------------  Cardiac Enzymes No results for input(s): TROPONINI in the last 168 hours. ------------------------------------------------------------------------------------------------------------------  RADIOLOGY:  DG Chest Portable 1 View Result Date: 07/10/2024 EXAM: 1 VIEW(S) XRAY OF THE CHEST 07/10/2024 12:42:00 AM COMPARISON: None available. CLINICAL HISTORY: Cough and weakness; COVID-19. FINDINGS: LUNGS AND PLEURA: No focal  pulmonary opacity. No pleural effusion. No pneumothorax. HEART AND MEDIASTINUM: No acute abnormality of the cardiac and mediastinal silhouettes. BONES AND SOFT TISSUES: No acute osseous abnormality. IMPRESSION: 1. No acute process. Electronically signed by: Pinkie Pebbles MD 07/10/2024 12:46 AM EST RP Workstation: [id number]      IMPRESSION AND PLAN:  Assessment and Plan: * Hyponatremia - The patient be admitted to a progressive unit bed. - We will continue admission with IV normal saline and limit p.o. fluid intake. - We will obtain hyponatremia workup. - I suspect that this is hypovolemic and due to his recent anorexia. - Will follow serial sodium levels. - Will avoid diuretics.  COVID-19 virus infection - Supportive management will be provided. - Will place on scheduled Mucinex and hydration as mentioned above. - He is maintaining his oxygen saturation and chest x-ray was negative.   Hypokalemia Potassium replacement will be provided.  Magnesium  will be optimized.  Paroxysmal atrial fibrillation (HCC) - She is not on anticoagulation being a high fall risk. - Will continue his flecainide  and atenolol .  Essential hypertension - Will continue antihypertensive therapy while holding of diuretic therapy  Blepharitis - Will place him on tobramycin  ophthalmic ointment.    DVT prophylaxis: Lovenox . Advanced Care Planning:  Code Status: full code. Family Communication:  The plan of care was discussed in details with the patient (and family). I answered all questions. The patient agreed to proceed with the above mentioned plan. Further management  will depend upon hospital course. Disposition Plan: Back to previous home environment Consults called: none. All the records are reviewed and case discussed with ED provider.  Status is: Inpatient  At the time of the admission, it appears that the appropriate admission status for this patient is inpatient.  This is judged to be  reasonable and necessary in order to provide the required intensity of service to ensure the patient's safety given the presenting symptoms, physical exam findings and initial radiographic and laboratory data in the context of comorbid conditions.  The patient requires inpatient status due to high intensity of service, high risk of further deterioration and high frequency of surveillance required.  I certify that at the time of admission, it is my clinical judgment that the patient will require inpatient hospital care extending more than 2 midnights.                            Dispo: The patient is from: Home              Anticipated d/c is to: Home              Patient currently is not medically stable to d/c.              Difficult to place patient: No  Madison DELENA Peaches M.D on 07/10/2024 at 5:46 AM  [facility]   From 7 PM-7 AM, contact night-coverage [email or web address]  CC: Primary care physician; Mylinda Lenis, MD     [1]  Allergies Allergen Reactions   Augmentin [Amoxicillin-Pot Clavulanate]    Hctz [Hydrochlorothiazide]    "
